# Patient Record
Sex: Female | Born: 1945 | Race: White | Hispanic: No | Marital: Married | Smoking: Former smoker
Health system: Southern US, Community
[De-identification: ages and names within clinical notes are randomized; demographics above are authoritative.]

## PROBLEM LIST (undated history)

## (undated) DIAGNOSIS — F429 Obsessive-compulsive disorder, unspecified: Secondary | ICD-10-CM

## (undated) DIAGNOSIS — I10 Essential (primary) hypertension: Secondary | ICD-10-CM

## (undated) DIAGNOSIS — F419 Anxiety disorder, unspecified: Secondary | ICD-10-CM

## (undated) DIAGNOSIS — Z87442 Personal history of urinary calculi: Secondary | ICD-10-CM

## (undated) DIAGNOSIS — M1990 Unspecified osteoarthritis, unspecified site: Secondary | ICD-10-CM

## (undated) HISTORY — PX: TUBAL LIGATION: SHX77

---

## 2011-06-28 ENCOUNTER — Emergency Department (HOSPITAL_BASED_OUTPATIENT_CLINIC_OR_DEPARTMENT_OTHER)
Admission: EM | Admit: 2011-06-28 | Discharge: 2011-06-28 | Disposition: A | Discharge status: Home / Self Care | Payer: Medicare Other | Attending: Emergency Medicine | Admitting: Emergency Medicine

## 2011-06-28 ENCOUNTER — Encounter (HOSPITAL_BASED_OUTPATIENT_CLINIC_OR_DEPARTMENT_OTHER): Payer: Self-pay | Admitting: Emergency Medicine

## 2011-06-28 DIAGNOSIS — IMO0002 Reserved for concepts with insufficient information to code with codable children: Secondary | ICD-10-CM | POA: Insufficient documentation

## 2011-06-28 DIAGNOSIS — S01511A Laceration without foreign body of lip, initial encounter: Secondary | ICD-10-CM

## 2011-06-28 DIAGNOSIS — S01501A Unspecified open wound of lip, initial encounter: Secondary | ICD-10-CM | POA: Insufficient documentation

## 2011-06-28 DIAGNOSIS — W19XXXA Unspecified fall, initial encounter: Secondary | ICD-10-CM

## 2011-06-28 DIAGNOSIS — W108XXA Fall (on) (from) other stairs and steps, initial encounter: Secondary | ICD-10-CM | POA: Insufficient documentation

## 2011-06-28 DIAGNOSIS — T148XXA Other injury of unspecified body region, initial encounter: Secondary | ICD-10-CM

## 2011-06-28 HISTORY — DX: Anxiety disorder, unspecified: F41.9

## 2011-06-28 HISTORY — DX: Obsessive-compulsive disorder, unspecified: F42.9

## 2011-06-28 HISTORY — DX: Essential (primary) hypertension: I10

## 2011-06-28 MED ORDER — TETANUS-DIPHTH-ACELL PERTUSSIS 5-2.5-18.5 LF-MCG/0.5 IM SUSP
0.5000 mL | Freq: Once | INTRAMUSCULAR | Status: AC
  Administered 2011-06-28: 0.5 mL via INTRAMUSCULAR
  Filled 2011-06-28: qty 0.5

## 2011-06-28 MED ORDER — LIDOCAINE HCL 2 % IJ SOLN
20.0000 mL | Freq: Once | INTRAMUSCULAR | Status: AC
  Administered 2011-06-28: 400 mg via INTRADERMAL
  Filled 2011-06-28: qty 1

## 2011-06-28 NOTE — ED Notes (Signed)
Pt tripped and fell while walking down steps.  Pt hit mouth, laceration to upper lip, bleeding controlled.  No LOC.  No vomiting.

## 2011-06-28 NOTE — Discharge Instructions (Signed)
Abrasions Abrasions are skin scrapes. Their treatment depends on how large and deep the abrasion is. Abrasions do not extend through all layers of the skin. A cut or lesion through all skin layers is called a laceration. HOME CARE INSTRUCTIONS   If you were given a dressing, change it at least once a day or as instructed by your caregiver. If the bandage sticks, soak it off with a solution of water or hydrogen peroxide.   Twice a day, wash the area with soap and water to remove all the cream/ointment. You may do this in a sink, under a tub faucet, or in a shower. Rinse off the soap and pat dry with a clean towel. Look for signs of infection (see below).   Reapply cream/ointment according to your caregiver's instruction. This will help prevent infection and keep the bandage from sticking. Telfa or gauze over the wound and under the dressing or wrap will also help keep the bandage from sticking.   If the bandage becomes wet, dirty, or develops a foul smell, change it as soon as possible.   Only take over-the-counter or prescription medicines for pain, discomfort, or fever as directed by your caregiver.  SEEK IMMEDIATE MEDICAL CARE IF:   Increasing pain in the wound.   Signs of infection develop: redness, swelling, surrounding area is tender to touch, or pus coming from the wound.   You have a fever.   Any foul smell coming from the wound or dressing.  Most skin wounds heal within ten days. Facial wounds heal faster. However, an infection may occur despite proper treatment. You should have the wound checked for signs of infection within 24 to 48 hours or sooner if problems arise. If you were not given a wound-check appointment, look closely at the wound yourself on the second day for early signs of infection listed above. MAKE SURE YOU:   Understand these instructions.   Will watch your condition.   Will get help right away if you are not doing well or get worse.  Document Released:  11/26/2004 Document Revised: 02/05/2011 Document Reviewed: 01/20/2011 [facility] Patient Information 2012 [place], [place].Laceration Care, Adult A laceration is a cut that goes through all layers of the skin. The cut goes into the tissue beneath the skin. HOME CARE For stitches (sutures) or staples:  Keep the cut clean and dry.   If you have a bandage (dressing), change it at least once a day. Change the bandage if it gets wet or dirty, or as told by your doctor.   Wash the cut with soap and water 2 times a day. Rinse the cut with water. Pat it dry with a clean towel.   Put a thin layer of medicated cream on the cut as told by your doctor.   You may shower after the first 24 hours. Do not soak the cut in water until the stitches are removed.   Only take medicines as told by your doctor.   Have your stitches or staples removed as told by your doctor.  For skin adhesive strips:  Keep the cut clean and dry.   Do not get the strips wet. You may take a bath, but be careful to keep the cut dry.   If the cut gets wet, pat it dry with a clean towel.   The strips will fall off on their own. Do not remove the strips that are still stuck to the cut.  For wound glue:  You may shower or take  baths. Do not soak or scrub the cut. Do not swim. Avoid heavy sweating until the glue falls off on its own. After a shower or bath, pat the cut dry with a clean towel.   Do not put medicine on your cut until the glue falls off.   If you have a bandage, do not put tape over the glue.   Avoid lots of sunlight or tanning lamps until the glue falls off. Put sunscreen on the cut for the first year to reduce your scar.   The glue will fall off on its own. Do not pick at the glue.  You may need a tetanus shot if:  You cannot remember when you had your last tetanus shot.   You have never had a tetanus shot.  If you need a tetanus shot and you choose not to have one, you may get tetanus. Sickness from tetanus  can be serious. GET HELP RIGHT AWAY IF:   Your pain does not get better with medicine.   Your arm, hand, leg, or foot loses feeling (numbness) or changes color.   Your cut is bleeding.   Your joint feels weak, or you cannot use your joint.   You have painful lumps on your body.   Your cut is red, puffy (swollen), or painful.   You have a red line on the skin near the cut.   You have yellowish-white fluid (pus) coming from the cut.   You have a fever.   You have a bad smell coming from the cut or bandage.   Your cut breaks open before or after stitches are removed.   You notice something coming out of the cut, such as wood or glass.   You cannot move a finger or toe.  MAKE SURE YOU:   Understand these instructions.   Will watch your condition.   Will get help right away if you are not doing well or get worse.  Document Released: 08/05/2007 Document Revised: 02/05/2011 Document Reviewed: 08/12/2010 [employer] [facility] Patient Information 2012 [place], [place].Stitches, Staples, or Skin Adhesive Strips  Stitches (sutures), staples, and skin adhesive strips hold the skin together as it heals. They will usually be in place for 7 days or less. HOME CARE  Wash your hands with soap and water before and after you touch your wound.   Only take medicine as told by your doctor.   Cover your wound only if your doctor told you to. Otherwise, leave it open to air.   Do not get your stitches wet or dirty. If they get dirty, dab them gently with a clean washcloth. Wet the washcloth with soapy water. Do not rub. Pat them dry gently.   Do not put medicine or medicated cream on your stitches unless your doctor told you to.   Do not take out your own stitches or staples. Skin adhesive strips will fall off by themselves.   Do not pick at the wound. Picking can cause an infection.   Do not miss your follow-up appointment.   If you have problems or questions, call your doctor.  GET HELP RIGHT  AWAY IF:   You have a temperature by mouth above 102 F (38.9 C), not controlled by medicine.   You have chills.   You have redness or pain around your stitches.   There is puffiness (swelling) around your stitches.   You notice fluid (drainage) from your stitches.   There is a bad smell coming from your wound.  MAKE  SURE YOU:  Understand these instructions.   Will watch your condition.   Will get help if you are not doing well or get worse.  Document Released: 12/14/2008 Document Revised: 02/05/2011 Document Reviewed: 12/14/2008 [facility] Patient Information 2012 [place], [place].

## 2011-06-28 NOTE — ED Provider Notes (Signed)
Medical screening examination/treatment/procedure(s) were performed by non-physician practitioner and as supervising physician I was immediately available for consultation/collaboration.  Gerhard Munch, MD 06/28/11 2157

## 2011-06-28 NOTE — ED Provider Notes (Signed)
History     CSN: [id number]  Arrival date & time 06/28/11  1827   First MD Initiated Contact with Patient 06/28/11 1847      Chief Complaint  Patient presents with  . Fall  . Lip Laceration    (Consider location/radiation/quality/duration/timing/severity/associated sxs/prior treatment) HPI Comments: Pt states that she tripped going up the stair and her lip his another step:pts states that she didn't have an loc:pt denies any problems opening and closing mouth  Patient is a 66 y.o. female presenting with fall. The history is provided by the patient. No language interpreter was used.  Fall The accident occurred less than 1 hour ago. She landed on concrete. Point of impact: upper lip. Pain location: upper lip. The pain is mild. She was ambulatory at the scene. There was no entrapment after the fall. There was no drug use involved in the accident. There was no alcohol use involved in the accident.    Past Medical History  Diagnosis Date  . Hypertension   . Vitamin d deficiency   . Anxiety   . OCD (obsessive compulsive disorder)     Past Surgical History  Procedure Date  . Tubal ligation     No family history on file.  History  Substance Use Topics  . Smoking status: Not on file  . Smokeless tobacco: Not on file  . Alcohol Use:     OB History    Grav Para Term Preterm Abortions TAB SAB Ect Mult Living                  Review of Systems  Constitutional: Negative.   HENT: Negative.   Respiratory: Negative.   Cardiovascular: Negative.   Musculoskeletal: Negative.   Skin: Positive for wound.    Allergies  Penicillins  Home Medications  No current outpatient prescriptions on file.  BP 129/81  Pulse 81  Temp(Src) 97.5 F (36.4 C) (Oral)  Resp 16  SpO2 98%  Physical Exam  Nursing note and vitals reviewed. Constitutional: She is oriented to person, place, and time. She appears well-developed and well-nourished.  HENT:       Pt has a laceration to the  right upper lip:pt has no dental injury noted:pt able to open and close without any problem  Eyes: Conjunctivae and EOM are normal. Pupils are equal, round, and reactive to light.  Neck: Neck supple.  Cardiovascular: Normal rate and regular rhythm.   Pulmonary/Chest: Effort normal and breath sounds normal.  Musculoskeletal: Normal range of motion.       Cervical back: Normal.       Thoracic back: Normal.       Lumbar back: Normal.       Abrasion to the right knee  Neurological: She is alert and oriented to person, place, and time.  Psychiatric: She has a normal mood and affect.    ED Course  LACERATION REPAIR Performed by: Teressa Lower Authorized by: Teressa Lower Consent: Verbal consent obtained. Written consent not obtained. Risks and benefits: risks, benefits and alternatives were discussed Consent given by: patient Patient understanding: patient states understanding of the procedure being performed Patient identity confirmed: verbally with patient Time out: Immediately prior to procedure a "time out" was called to verify the correct patient, procedure, equipment, support staff and site/side marked as required. Body area: head/neck Location details: upper lip Full thickness lip laceration: no Vermillion border involved: yes Lip laceration height: vermillion only Laceration length: 2 cm Foreign bodies: no foreign bodies Anesthesia: local  infiltration Local anesthetic: lidocaine 2% without epinephrine Irrigation solution: saline Irrigation method: syringe Amount of cleaning: standard Skin closure: 6-0 Prolene Number of sutures: 4 Technique: simple Approximation: close Approximation difficulty: simple Lip approximation: vermillion border well aligned Patient tolerance: Patient tolerated the procedure well with no immediate complications.   (including critical care time)  Labs Reviewed - No data to display No results found.   1. Lip laceration   2. Fall     3. Abrasion       MDM  Wound closed without any problem:don't think any imaging is needed at this time        Teressa Lower, NP 06/28/11 1930

## 2012-08-23 ENCOUNTER — Encounter (HOSPITAL_BASED_OUTPATIENT_CLINIC_OR_DEPARTMENT_OTHER): Payer: Self-pay | Admitting: Student

## 2012-08-23 ENCOUNTER — Emergency Department (HOSPITAL_BASED_OUTPATIENT_CLINIC_OR_DEPARTMENT_OTHER)
Admission: EM | Admit: 2012-08-23 | Discharge: 2012-08-23 | Disposition: A | Discharge status: Home / Self Care | Payer: Medicare Other | Attending: Emergency Medicine | Admitting: Emergency Medicine

## 2012-08-23 ENCOUNTER — Emergency Department (HOSPITAL_BASED_OUTPATIENT_CLINIC_OR_DEPARTMENT_OTHER): Payer: Medicare Other

## 2012-08-23 DIAGNOSIS — F429 Obsessive-compulsive disorder, unspecified: Secondary | ICD-10-CM | POA: Insufficient documentation

## 2012-08-23 DIAGNOSIS — F29 Unspecified psychosis not due to a substance or known physiological condition: Secondary | ICD-10-CM | POA: Insufficient documentation

## 2012-08-23 DIAGNOSIS — Z880 Allergy status to penicillin: Secondary | ICD-10-CM | POA: Insufficient documentation

## 2012-08-23 DIAGNOSIS — N390 Urinary tract infection, site not specified: Secondary | ICD-10-CM | POA: Insufficient documentation

## 2012-08-23 DIAGNOSIS — F411 Generalized anxiety disorder: Secondary | ICD-10-CM | POA: Insufficient documentation

## 2012-08-23 DIAGNOSIS — E559 Vitamin D deficiency, unspecified: Secondary | ICD-10-CM | POA: Insufficient documentation

## 2012-08-23 DIAGNOSIS — Z79899 Other long term (current) drug therapy: Secondary | ICD-10-CM | POA: Insufficient documentation

## 2012-08-23 DIAGNOSIS — I10 Essential (primary) hypertension: Secondary | ICD-10-CM | POA: Insufficient documentation

## 2012-08-23 LAB — COMPREHENSIVE METABOLIC PANEL
ALT: 27 U/L (ref 0–35)
BUN: 15 mg/dL (ref 6–23)
CO2: 30 mEq/L (ref 19–32)
Calcium: 9.9 mg/dL (ref 8.4–10.5)
GFR calc Af Amer: 58 mL/min — ABNORMAL LOW (ref >90)
GFR calc non Af Amer: 50 mL/min — ABNORMAL LOW (ref >90)
Glucose, Bld: 129 mg/dL — ABNORMAL HIGH (ref 70–99)
Sodium: 141 mEq/L (ref 135–145)
Total Protein: 7.1 g/dL (ref 6.0–8.3)

## 2012-08-23 LAB — ETHANOL: Alcohol, Ethyl (B): <11 mg/dL (ref 0–11)

## 2012-08-23 LAB — URINE MICROSCOPIC-ADD ON

## 2012-08-23 LAB — URINALYSIS, ROUTINE W REFLEX MICROSCOPIC
Bilirubin Urine: NEGATIVE
Glucose, UA: NEGATIVE mg/dL
Hgb urine dipstick: NEGATIVE
Ketones, ur: NEGATIVE mg/dL
Protein, ur: NEGATIVE mg/dL
Urobilinogen, UA: 0.2 mg/dL (ref 0.0–1.0)

## 2012-08-23 LAB — CBC WITH DIFFERENTIAL/PLATELET
Basophils Relative: 0 % (ref 0–1)
Eosinophils Absolute: 0 10*3/uL (ref 0.0–0.7)
Eosinophils Relative: 0 % (ref 0–5)
Hemoglobin: 15.2 g/dL — ABNORMAL HIGH (ref 12.0–15.0)
Lymphs Abs: 1 10*3/uL (ref 0.7–4.0)
MCH: 30.3 pg (ref 26.0–34.0)
MCHC: 33.8 g/dL (ref 30.0–36.0)
MCV: 89.6 fL (ref 78.0–100.0)
Monocytes Absolute: 0.5 10*3/uL (ref 0.1–1.0)
Monocytes Relative: 7 % (ref 3–12)
Neutrophils Relative %: 79 % — ABNORMAL HIGH (ref 43–77)
RBC: 5.02 MIL/uL (ref 3.87–5.11)

## 2012-08-23 LAB — RAPID URINE DRUG SCREEN, HOSP PERFORMED
Barbiturates: NOT DETECTED
Cocaine: NOT DETECTED

## 2012-08-23 MED ORDER — SULFAMETHOXAZOLE-TRIMETHOPRIM 800-160 MG PO TABS: 1.0000 | ORAL_TABLET | Freq: Two times a day (BID) | ORAL | Status: DC

## 2012-08-23 MED ORDER — SODIUM CHLORIDE 0.9 % IV BOLUS (SEPSIS)
1000.0000 mL | Freq: Once | INTRAVENOUS | Status: AC
  Administered 2012-08-23: 1000 mL via INTRAVENOUS

## 2012-08-23 NOTE — ED Notes (Signed)
Patient unable to void at this time

## 2012-08-23 NOTE — ED Notes (Signed)
Pt in with c/o feeling "off" or "unbalance" and reports starting Xanax 2 weeks ago. Reports onset yesterday morning. Gait steady, ambulates well. Speech is clear but pt does have occasional hesitation with getting words out. Grips= and strong. Denies N V D CP LOC SOB dizziness headaches or visual disturbances

## 2012-08-23 NOTE — ED Provider Notes (Signed)
History    CSN: [id number] Arrival date & time 08/23/12  4098  First MD Initiated Contact with Patient 08/23/12 [phone]     Chief Complaint  Patient presents with  . Altered Mental Status   (Consider location/radiation/quality/duration/timing/severity/associated sxs/prior Treatment) Patient is a 67 y.o. female presenting with altered mental status. The history is provided by the spouse.  Altered Mental Status Presenting symptoms: confusion   Severity:  Moderate Most recent episode:  Yesterday Episode history:  Continuous Duration:  1 day Timing:  Constant Progression:  Worsening Chronicity:  New Context: recent change in medication   Context: not alcohol use, not head injury, not a recent illness and not a recent infection   Context comment:  States that she could not figure out how to put on her underwear today, yesterday did not know how to put a cap on her bottle and several otehr things which is very unusual for her Associated symptoms: no abdominal pain, no bladder incontinence, no decreased appetite, no fever, no nausea, no seizures, no slurred speech, no vomiting and no weakness   Associated symptoms comment:  No slurred speech but does hesitate occasionally and also sometimes says things that don't make sense.  Also shuffling unsteady gait.  Past Medical History  Diagnosis Date  . Hypertension   . Vitamin D deficiency   . Anxiety   . OCD (obsessive compulsive disorder)    Past Surgical History  Procedure Laterality Date  . Tubal ligation     History reviewed. No pertinent family history. History  Substance Use Topics  . Smoking status: Not on file  . Smokeless tobacco: Not on file  . Alcohol Use: Yes   OB History   Grav Para Term Preterm Abortions TAB SAB Ect Mult Living                 Review of Systems  Constitutional: Negative for fever and decreased appetite.  Gastrointestinal: Negative for nausea, vomiting and abdominal pain.  Genitourinary: Negative  for bladder incontinence.  Neurological: Negative for seizures and weakness.  Psychiatric/Behavioral: Positive for confusion and altered mental status.  All other systems reviewed and are negative.    Allergies  Penicillins  Home Medications   Current Outpatient Rx  Name  Route  Sig  Dispense  Refill  . cholecalciferol (VITAMIN D) 1000 UNITS tablet   Oral   Take 1,000 Units by mouth daily.         . fluvoxaMINE (LUVOX) 100 MG tablet   Oral   Take 100 mg by mouth at bedtime.         . hydrochlorothiazide (HYDRODIURIL) 25 MG tablet   Oral   Take 25 mg by mouth daily.         . Multiple Vitamins-Minerals (CENTRUM SILVER) tablet   Oral   Take 1 tablet by mouth daily.         Marland Kitchen OLANZapine (ZYPREXA) 10 MG tablet   Oral   Take 10 mg by mouth at bedtime.          BP 169/88  Pulse 78  Temp(Src) 97.6 F (36.4 C) (Oral)  Resp 16  Ht 5\' 6"  (1.676 m)  Wt 165 lb (74.844 kg)  BMI 26.64 kg/m2  SpO2 97% Physical Exam  Nursing note and vitals reviewed. Constitutional: She is oriented to person, place, and time. She appears well-developed and well-nourished. No distress.  HENT:  Head: Normocephalic and atraumatic.  Mouth/Throat: Oropharynx is clear and moist.  Eyes: Conjunctivae and  EOM are normal. Pupils are equal, round, and reactive to light.  Neck: Normal range of motion. Neck supple. Carotid bruit is not present.  Cardiovascular: Normal rate, regular rhythm and intact distal pulses.   No murmur heard. Pulmonary/Chest: Effort normal and breath sounds normal. No respiratory distress. She has no wheezes. She has no rales.  Abdominal: Soft. She exhibits no distension. There is no tenderness. There is no rebound and no guarding.  Musculoskeletal: Normal range of motion. She exhibits no edema and no tenderness.  Neurological: She is alert and oriented to person, place, and time. No cranial nerve deficit.  Mild intention tremor in all ext.  Normal strenght and  sensation.  Speech is hesitant and sometimes makes statements that don't make sense  Skin: Skin is warm and dry. No rash noted. No erythema.  Psychiatric: She has a normal mood and affect. Her behavior is normal.    ED Course  Procedures (including critical care time) Labs Reviewed  CBC WITH DIFFERENTIAL - Abnormal; Notable for the following:    Hemoglobin 15.2 (*)    Neutrophils Relative % 79 (*)    All other components within normal limits  URINALYSIS, ROUTINE W REFLEX MICROSCOPIC - Abnormal; Notable for the following:    Nitrite POSITIVE (*)    Leukocytes, UA MODERATE (*)    All other components within normal limits  COMPREHENSIVE METABOLIC PANEL - Abnormal; Notable for the following:    Potassium 3.0 (*)    Glucose, Bld 129 (*)    GFR calc non Af Amer 50 (*)    GFR calc Af Amer 58 (*)    All other components within normal limits  URINE MICROSCOPIC-ADD ON - Abnormal; Notable for the following:    Bacteria, UA MANY (*)    All other components within normal limits  URINE CULTURE  ETHANOL  URINE RAPID DRUG SCREEN (HOSP PERFORMED)   Ct Head Wo Contrast  08/23/2012   *RADIOLOGY REPORT*  Clinical Data: Confusion.  Altered mental status.  CT HEAD WITHOUT CONTRAST  Technique:  Contiguous axial images were obtained from the base of the skull through the vertex without contrast.  Comparison: None.  Findings: No mass lesion, mass effect, midline shift, hydrocephalus, hemorrhage.  No territorial ischemia or acute infarction.  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  Mastoid air cells are clear.  Paranasal sinuses appear within normal limits.  IMPRESSION: No acute intracranial abnormality.  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   Original Report Authenticated By: Andreas Newport, M.D.   1. UTI (lower urinary tract infection)     MDM   Patient is here do to worsening confusion over the last 24 hours. Husband states that she had a heart  time putting on her underwear this morning, she has been unsteady on her feet and also having difficulty getting words out. Facial droop or focal weakness. Patient has had a normal appetite without fever or infectious symptoms. Husband states she did start Xanax 2 weeks ago and takes 0.5 mg one time a day. He states her symptoms do not seem to be worse or better in the morning or night. He does state she's had something like this in the past but not this bad and was related to medications. Other than the Xanax the Luvox is the only other more recent medication but she's been on it for 2 months.  Patient has no focal neurologic finding but does have slow hesitant speech and occasionally will say things that  don't make sense. Mild tremor in bilateral upper extremity and shuffling unsteady gait. She was awake and alert and oriented x3. She denies any drug or alcohol use.  Consent the patient's symptoms are related to medication versus infection versus electrolyte abnormality versus brain lesion. Low suspicion for prescription at this time  CBC, CMP, UA, UDS, EtOH, head CT pending.  1:22 PM Labs consistent with UTI which would explain her sx.  No acute abnormalities on head CT   Gwyneth Sprout, MD 08/23/12 1322

## 2012-08-25 LAB — URINE CULTURE

## 2012-08-26 NOTE — ED Notes (Signed)
Post ED Visit - Positive Culture Follow-up  Culture report reviewed by antimicrobial stewardship pharmacist: []  Wes Dulaney, Pharm.D., BCPS [x]  Celedonio Miyamoto, Pharm.D., BCPS []  Georgina Pillion, Pharm.D., BCPS []  [place], [address].D., BCPS, AAHIVP []  Estella Husk, Pharm.D., BCPS, AAHIVP  Positive Urine culture Treated with Sulfa-Trim, organism sensitive to the same and no further patient follow-up is required at this time.  Larena Sox 08/26/2012, 2:10 PM

## 2013-01-05 ENCOUNTER — Other Ambulatory Visit: Payer: Self-pay | Admitting: Family Medicine

## 2013-01-05 DIAGNOSIS — Z1231 Encounter for screening mammogram for malignant neoplasm of breast: Secondary | ICD-10-CM

## 2013-03-10 ENCOUNTER — Ambulatory Visit
Admission: RE | Admit: 2013-03-10 | Discharge: 2013-03-10 | Disposition: A | Discharge status: Home / Self Care | Payer: Medicare Other | Source: Ambulatory Visit | Attending: Family Medicine | Admitting: Family Medicine

## 2013-03-10 DIAGNOSIS — Z1231 Encounter for screening mammogram for malignant neoplasm of breast: Secondary | ICD-10-CM

## 2014-01-08 ENCOUNTER — Other Ambulatory Visit (HOSPITAL_COMMUNITY)
Admission: RE | Admit: 2014-01-08 | Discharge: 2014-01-08 | Disposition: A | Discharge status: Home / Self Care | Payer: Medicare Other | Source: Ambulatory Visit | Attending: Family Medicine | Admitting: Family Medicine

## 2014-01-08 ENCOUNTER — Other Ambulatory Visit: Payer: Self-pay | Admitting: Family Medicine

## 2014-01-08 DIAGNOSIS — Z124 Encounter for screening for malignant neoplasm of cervix: Secondary | ICD-10-CM | POA: Insufficient documentation

## 2014-01-11 LAB — CYTOLOGY - PAP

## 2014-03-21 DIAGNOSIS — R4189 Other symptoms and signs involving cognitive functions and awareness: Secondary | ICD-10-CM | POA: Insufficient documentation

## 2014-03-21 DIAGNOSIS — G4733 Obstructive sleep apnea (adult) (pediatric): Secondary | ICD-10-CM | POA: Diagnosis present

## 2014-03-21 DIAGNOSIS — R269 Unspecified abnormalities of gait and mobility: Secondary | ICD-10-CM

## 2014-03-21 DIAGNOSIS — G629 Polyneuropathy, unspecified: Secondary | ICD-10-CM | POA: Insufficient documentation

## 2014-05-30 DIAGNOSIS — G4734 Idiopathic sleep related nonobstructive alveolar hypoventilation: Secondary | ICD-10-CM | POA: Insufficient documentation

## 2015-01-21 ENCOUNTER — Other Ambulatory Visit: Payer: Self-pay

## 2015-01-21 ENCOUNTER — Other Ambulatory Visit: Payer: Self-pay | Admitting: Family Medicine

## 2015-01-21 DIAGNOSIS — Z1231 Encounter for screening mammogram for malignant neoplasm of breast: Secondary | ICD-10-CM

## 2015-01-21 DIAGNOSIS — R911 Solitary pulmonary nodule: Secondary | ICD-10-CM

## 2015-01-28 ENCOUNTER — Ambulatory Visit
Admission: RE | Admit: 2015-01-28 | Discharge: 2015-01-28 | Disposition: A | Discharge status: Home / Self Care | Payer: Medicare Other | Source: Ambulatory Visit | Attending: Family Medicine | Admitting: Family Medicine

## 2015-01-28 DIAGNOSIS — R911 Solitary pulmonary nodule: Secondary | ICD-10-CM

## 2015-02-13 ENCOUNTER — Ambulatory Visit
Admission: RE | Admit: 2015-02-13 | Discharge: 2015-02-13 | Disposition: A | Discharge status: Home / Self Care | Payer: Medicare Other | Source: Ambulatory Visit

## 2015-02-13 DIAGNOSIS — Z1231 Encounter for screening mammogram for malignant neoplasm of breast: Secondary | ICD-10-CM

## 2015-09-11 DIAGNOSIS — Z9181 History of falling: Secondary | ICD-10-CM

## 2016-01-20 ENCOUNTER — Other Ambulatory Visit: Payer: Self-pay | Admitting: Family Medicine

## 2016-01-20 DIAGNOSIS — Z1231 Encounter for screening mammogram for malignant neoplasm of breast: Secondary | ICD-10-CM

## 2016-02-28 ENCOUNTER — Ambulatory Visit
Admission: RE | Admit: 2016-02-28 | Discharge: 2016-02-28 | Disposition: A | Discharge status: Home / Self Care | Payer: Medicare Other | Source: Ambulatory Visit | Attending: Family Medicine | Admitting: Family Medicine

## 2016-02-28 DIAGNOSIS — Z1231 Encounter for screening mammogram for malignant neoplasm of breast: Secondary | ICD-10-CM

## 2017-03-19 ENCOUNTER — Other Ambulatory Visit: Payer: Self-pay

## 2017-03-19 ENCOUNTER — Inpatient Hospital Stay (HOSPITAL_BASED_OUTPATIENT_CLINIC_OR_DEPARTMENT_OTHER)
Admission: EM | Admit: 2017-03-19 | Discharge: 2017-03-29 | DRG: 405 | Disposition: A | Discharge status: Home Health | Payer: Medicare Other | Attending: Surgery | Admitting: Surgery

## 2017-03-19 ENCOUNTER — Encounter (HOSPITAL_BASED_OUTPATIENT_CLINIC_OR_DEPARTMENT_OTHER): Payer: Self-pay | Admitting: *Deleted

## 2017-03-19 DIAGNOSIS — Z880 Allergy status to penicillin: Secondary | ICD-10-CM

## 2017-03-19 DIAGNOSIS — F429 Obsessive-compulsive disorder, unspecified: Secondary | ICD-10-CM | POA: Diagnosis present

## 2017-03-19 DIAGNOSIS — E876 Hypokalemia: Secondary | ICD-10-CM

## 2017-03-19 DIAGNOSIS — K822 Perforation of gallbladder: Secondary | ICD-10-CM

## 2017-03-19 DIAGNOSIS — I10 Essential (primary) hypertension: Secondary | ICD-10-CM | POA: Diagnosis present

## 2017-03-19 DIAGNOSIS — T8143XA Infection following a procedure, organ and space surgical site, initial encounter: Secondary | ICD-10-CM

## 2017-03-19 DIAGNOSIS — K810 Acute cholecystitis: Principal | ICD-10-CM | POA: Diagnosis present

## 2017-03-19 DIAGNOSIS — K653 Choleperitonitis: Secondary | ICD-10-CM

## 2017-03-19 DIAGNOSIS — F0390 Unspecified dementia without behavioral disturbance: Secondary | ICD-10-CM | POA: Diagnosis present

## 2017-03-19 DIAGNOSIS — K812 Acute cholecystitis with chronic cholecystitis: Secondary | ICD-10-CM

## 2017-03-19 DIAGNOSIS — K819 Cholecystitis, unspecified: Secondary | ICD-10-CM

## 2017-03-19 DIAGNOSIS — E860 Dehydration: Secondary | ICD-10-CM | POA: Diagnosis present

## 2017-03-19 DIAGNOSIS — Z9049 Acquired absence of other specified parts of digestive tract: Secondary | ICD-10-CM

## 2017-03-19 DIAGNOSIS — R269 Unspecified abnormalities of gait and mobility: Secondary | ICD-10-CM

## 2017-03-19 DIAGNOSIS — D72829 Elevated white blood cell count, unspecified: Secondary | ICD-10-CM

## 2017-03-19 DIAGNOSIS — K82A2 Perforation of gallbladder in cholecystitis: Secondary | ICD-10-CM | POA: Diagnosis present

## 2017-03-19 DIAGNOSIS — R1310 Dysphagia, unspecified: Secondary | ICD-10-CM | POA: Diagnosis present

## 2017-03-19 DIAGNOSIS — Z9181 History of falling: Secondary | ICD-10-CM

## 2017-03-19 DIAGNOSIS — K660 Peritoneal adhesions (postprocedural) (postinfection): Secondary | ICD-10-CM | POA: Diagnosis present

## 2017-03-19 DIAGNOSIS — Z79899 Other long term (current) drug therapy: Secondary | ICD-10-CM

## 2017-03-19 DIAGNOSIS — T8149XA Infection following a procedure, other surgical site, initial encounter: Secondary | ICD-10-CM

## 2017-03-19 DIAGNOSIS — Z87891 Personal history of nicotine dependence: Secondary | ICD-10-CM

## 2017-03-19 DIAGNOSIS — F419 Anxiety disorder, unspecified: Secondary | ICD-10-CM

## 2017-03-19 DIAGNOSIS — G4733 Obstructive sleep apnea (adult) (pediatric): Secondary | ICD-10-CM | POA: Diagnosis present

## 2017-03-19 LAB — COMPREHENSIVE METABOLIC PANEL
ALT: 40 U/L (ref 14–54)
AST: 46 U/L — ABNORMAL HIGH (ref 15–41)
Albumin: 3.7 g/dL (ref 3.5–5.0)
Alkaline Phosphatase: 109 U/L (ref 38–126)
Anion gap: 11 (ref 5–15)
BUN: 17 mg/dL (ref 6–20)
CHLORIDE: 99 mmol/L — AB (ref 101–111)
CO2: 25 mmol/L (ref 22–32)
CREATININE: 0.82 mg/dL (ref 0.44–1.00)
Calcium: 8.6 mg/dL — ABNORMAL LOW (ref 8.9–10.3)
Glucose, Bld: 169 mg/dL — ABNORMAL HIGH (ref 65–99)
POTASSIUM: 3.2 mmol/L — AB (ref 3.5–5.1)
Sodium: 135 mmol/L (ref 135–145)
Total Bilirubin: 0.8 mg/dL (ref 0.3–1.2)
Total Protein: 7.1 g/dL (ref 6.5–8.1)

## 2017-03-19 LAB — URINALYSIS, ROUTINE W REFLEX MICROSCOPIC
BILIRUBIN URINE: NEGATIVE
Glucose, UA: NEGATIVE mg/dL
Ketones, ur: NEGATIVE mg/dL
Nitrite: POSITIVE — AB
PROTEIN: NEGATIVE mg/dL
Specific Gravity, Urine: 1.01 (ref 1.005–1.030)
pH: 6 (ref 5.0–8.0)

## 2017-03-19 LAB — CBC WITH DIFFERENTIAL/PLATELET
BASOS ABS: 0 10*3/uL (ref 0.0–0.1)
BASOS PCT: 0 %
EOS ABS: 0 10*3/uL (ref 0.0–0.7)
EOS PCT: 0 %
HCT: 37.8 % (ref 36.0–46.0)
Hemoglobin: 11.9 g/dL — ABNORMAL LOW (ref 12.0–15.0)
Lymphocytes Relative: 8 %
Lymphs Abs: 1.2 10*3/uL (ref 0.7–4.0)
MCH: 27 pg (ref 26.0–34.0)
MCHC: 31.5 g/dL (ref 30.0–36.0)
MCV: 85.9 fL (ref 78.0–100.0)
Monocytes Absolute: 0.8 10*3/uL (ref 0.1–1.0)
Monocytes Relative: 5 %
Neutro Abs: 12.5 10*3/uL — ABNORMAL HIGH (ref 1.7–7.7)
Neutrophils Relative %: 87 %
PLATELETS: 198 10*3/uL (ref 150–400)
RBC: 4.4 MIL/uL (ref 3.87–5.11)
RDW: 15.8 % — ABNORMAL HIGH (ref 11.5–15.5)
WBC: 14.5 10*3/uL — AB (ref 4.0–10.5)

## 2017-03-19 LAB — URINALYSIS, MICROSCOPIC (REFLEX)

## 2017-03-19 LAB — LIPASE, BLOOD: LIPASE: 47 U/L (ref 11–51)

## 2017-03-19 MED ORDER — FENTANYL CITRATE (PF) 100 MCG/2ML IJ SOLN
50.0000 ug | Freq: Once | INTRAMUSCULAR | Status: AC
  Administered 2017-03-19: 50 ug via INTRAVENOUS
  Filled 2017-03-19: qty 2

## 2017-03-19 MED ORDER — ONDANSETRON HCL 4 MG/2ML IJ SOLN
4.0000 mg | Freq: Once | INTRAMUSCULAR | Status: AC
  Administered 2017-03-19: 4 mg via INTRAVENOUS
  Filled 2017-03-19: qty 2

## 2017-03-19 NOTE — ED Notes (Signed)
EDP into room 

## 2017-03-19 NOTE — ED Triage Notes (Signed)
Pt c/o right lower abd pain  x 15 hrs

## 2017-03-19 NOTE — ED Provider Notes (Signed)
TIME SEEN: 11:45 PM  CHIEF COMPLAINT: Right upper abdominal pain  HPI: Patient is a [age] year old female with history of anxiety, hypertension who presents emergency department with right upper quadrant abdominal pain that started this morning.  Describes as sharp, severe without radiation.  Did have diarrhea yesterday.  No nausea, vomiting, fever, chills, bloody stools, melena, dysuria, hematuria, vaginal bleeding or discharge.  She has had a previous BTL.  Has not had a bowel movement today.  Has never had similar symptoms.  No known aggravating or factors.  No chest pain or shortness of breath.  PCP - Corliss BlackerMcNeill with [facility]  ROS: See HPI Constitutional: no fever  Eyes: no drainage  ENT: no runny nose   Cardiovascular:  no chest pain  Resp: no SOB  GI: no vomiting GU: no dysuria Integumentary: no rash  Allergy: no hives  Musculoskeletal: no leg swelling  Neurological: no slurred speech ROS otherwise negative  PAST MEDICAL HISTORY/PAST SURGICAL HISTORY:  Past Medical History:  Diagnosis Date  . Anxiety   . Hypertension   . OCD (obsessive compulsive disorder)   . Vitamin D deficiency     MEDICATIONS:  Prior to Admission medications   Medication Sig Start Date End Date Taking? Authorizing Provider  cholecalciferol (VITAMIN D) 1000 UNITS tablet Take 1,000 Units by mouth daily.    [provider]  fluvoxaMINE (LUVOX) 100 MG tablet Take 100 mg by mouth at bedtime.    [provider]  hydrochlorothiazide (HYDRODIURIL) 25 MG tablet Take 25 mg by mouth daily.    [provider]  Multiple Vitamins-Minerals (CENTRUM SILVER) tablet Take 1 tablet by mouth daily.    [provider]  OLANZapine (ZYPREXA) 10 MG tablet Take 10 mg by mouth at bedtime.    [provider]    ALLERGIES:  Allergies  Allergen Reactions  . Penicillins Hives    SOCIAL HISTORY:  Social History   Tobacco Use  . Smoking status: Former Games developermoker  . Smokeless tobacco: Never  Used  Substance Use Topics  . Alcohol use: Yes    FAMILY HISTORY: History reviewed. No pertinent family history.  EXAM: BP 108/73 (BP Location: Left Arm)   Pulse (!) 101   Temp 98 F (36.7 C) (Oral)   Resp (!) 26   Ht 5\' 4"  (1.626 m)   Wt 77.1 kg (170 lb)   SpO2 96%   BMI 29.18 kg/m  CONSTITUTIONAL: Alert and oriented and responds appropriately to questions.  Elderly, appears uncomfortable HEAD: Normocephalic EYES: Conjunctivae clear, pupils appear equal, EOMI ENT: normal nose; moist mucous membranes NECK: Supple, no meningismus, no nuchal rigidity, no LAD  CARD: RRR; S1 and S2 appreciated; no murmurs, no clicks, no rubs, no gallops RESP: Normal chest excursion without splinting or tachypnea; breath sounds clear and equal bilaterally; no wheezes, no rhonchi, no rales, no hypoxia or respiratory distress, speaking full sentences ABD/GI: Normal bowel sounds; non-distended; soft, mildly tender throughout the abdomen but especially in the right upper quadrant with a positive Murphy sign, no specific tenderness at McBurney's point, no rebound, no guarding, no peritoneal signs, no hepatosplenomegaly BACK:  The back appears normal and is non-tender to palpation, there is no CVA tenderness EXT: Normal ROM in all joints; non-tender to palpation; no edema; normal capillary refill; no cyanosis, no calf tenderness or swelling    SKIN: Normal color for age and race; warm; no rash NEURO: Moves all extremities equally PSYCH: The patient's mood and manner are appropriate. Grooming and personal hygiene are  appropriate.  MEDICAL DECISION MAKING: Patient here with right upper quadrant pain with positive Murphy sign.  Concern for cholecystitis.  Differential also includes pancreatitis, gastritis, bowel obstruction, colitis.  Less likely appendicitis, pyelonephritis, UTI.  No urinary symptoms.  Unfortunately at this time we are unable to obtain a ultrasound at [facility].  Will obtain a CT scan  for further evaluation.  Will give IV fluids, pain and nausea medicine.  Labs and urine pending.  ED PROGRESS: Labs show leukocytosis of 14,000 with left shift.  Mildly elevated AST but otherwise normal LFTs and lipase.  Urine does show nitrites and many bacteria with trace leukocytes.  We will add on urine culture but she is not currently having urinary symptoms.  CT scan pending.   1:20 AM  CT scan shows inflammatory changes of the gallbladder consistent with acute cholecystitis.  No calcified stones visualized.  She also has gallbladder edema noted.  There is mild dilation of the intrahepatic biliary tree and common bile duct.  She has a moderate hiatal hernia.  No other acute abnormality.  Appendix not visualized but no inflammatory changes within the right lower quadrant.  Patient will be given ceftriaxone and Flagyl.  Rocephin would also cover for possible UTI given urine results.  D/w Dr. Michaell Cowing with general surgery.  He would like patient transferred to the [facility] emergency department where he will evaluate her in the ED.  Patient and husband have been updated with this plan.  They are comfortable with transport by [facility] to [facility].  She remains n.p.o.  She is receiving IV fluids and has standing pain and nausea medicine ordered.   1:30 AM  D/w Dr. Preston Fleeting with [facility] who agrees to accept patient in transfer.     2:20 AM  [facility] at bedside for transport.  Patient remains hemodynamically stable.  Pain controlled currently.   I reviewed all nursing notes, vitals, pertinent previous records, EKGs, lab and urine results, imaging (as available).     Ward, Layla Maw, DO 03/20/17 [phone]

## 2017-03-19 NOTE — ED Notes (Signed)
Alert, NAD, calm, interactive, resps e/u, speaking in clear complete sentences, no dyspnea noted, skin W&D, VSS, c/o RUQ pain, onset this morning, recent family GI illness last week, also admits to diarrhea yesterday (watery/green), (denies: CP, back pain, GU sx, NVD, fever, sob, syncope, bleeding, dizziness or visual changes). Last ate 1700 (toast), last BM yesterday. No relief with tylenol. [facility]

## 2017-03-20 ENCOUNTER — Encounter (HOSPITAL_BASED_OUTPATIENT_CLINIC_OR_DEPARTMENT_OTHER): Payer: Self-pay

## 2017-03-20 ENCOUNTER — Inpatient Hospital Stay (HOSPITAL_COMMUNITY): Payer: Medicare Other | Admitting: Anesthesiology

## 2017-03-20 ENCOUNTER — Inpatient Hospital Stay (HOSPITAL_COMMUNITY): Payer: Medicare Other

## 2017-03-20 ENCOUNTER — Encounter (HOSPITAL_COMMUNITY): Admission: EM | Disposition: A | Discharge status: Home Health | Payer: Self-pay | Source: Home / Self Care

## 2017-03-20 ENCOUNTER — Emergency Department (HOSPITAL_BASED_OUTPATIENT_CLINIC_OR_DEPARTMENT_OTHER): Payer: Medicare Other

## 2017-03-20 DIAGNOSIS — K653 Choleperitonitis: Secondary | ICD-10-CM | POA: Diagnosis present

## 2017-03-20 DIAGNOSIS — Z87891 Personal history of nicotine dependence: Secondary | ICD-10-CM | POA: Diagnosis not present

## 2017-03-20 DIAGNOSIS — F419 Anxiety disorder, unspecified: Secondary | ICD-10-CM

## 2017-03-20 DIAGNOSIS — R1310 Dysphagia, unspecified: Secondary | ICD-10-CM | POA: Diagnosis present

## 2017-03-20 DIAGNOSIS — E876 Hypokalemia: Secondary | ICD-10-CM | POA: Diagnosis present

## 2017-03-20 DIAGNOSIS — E860 Dehydration: Secondary | ICD-10-CM | POA: Diagnosis present

## 2017-03-20 DIAGNOSIS — G4733 Obstructive sleep apnea (adult) (pediatric): Secondary | ICD-10-CM | POA: Diagnosis present

## 2017-03-20 DIAGNOSIS — K822 Perforation of gallbladder: Secondary | ICD-10-CM

## 2017-03-20 DIAGNOSIS — Z9049 Acquired absence of other specified parts of digestive tract: Secondary | ICD-10-CM

## 2017-03-20 DIAGNOSIS — Z79899 Other long term (current) drug therapy: Secondary | ICD-10-CM | POA: Diagnosis not present

## 2017-03-20 DIAGNOSIS — K8000 Calculus of gallbladder with acute cholecystitis without obstruction: Secondary | ICD-10-CM | POA: Insufficient documentation

## 2017-03-20 DIAGNOSIS — K82A2 Perforation of gallbladder in cholecystitis: Secondary | ICD-10-CM | POA: Diagnosis present

## 2017-03-20 DIAGNOSIS — K660 Peritoneal adhesions (postprocedural) (postinfection): Secondary | ICD-10-CM | POA: Diagnosis present

## 2017-03-20 DIAGNOSIS — I10 Essential (primary) hypertension: Secondary | ICD-10-CM | POA: Diagnosis present

## 2017-03-20 DIAGNOSIS — F429 Obsessive-compulsive disorder, unspecified: Secondary | ICD-10-CM | POA: Diagnosis present

## 2017-03-20 DIAGNOSIS — Z9181 History of falling: Secondary | ICD-10-CM | POA: Diagnosis not present

## 2017-03-20 DIAGNOSIS — F0390 Unspecified dementia without behavioral disturbance: Secondary | ICD-10-CM | POA: Diagnosis present

## 2017-03-20 DIAGNOSIS — K810 Acute cholecystitis: Secondary | ICD-10-CM | POA: Diagnosis present

## 2017-03-20 DIAGNOSIS — K812 Acute cholecystitis with chronic cholecystitis: Secondary | ICD-10-CM | POA: Diagnosis present

## 2017-03-20 DIAGNOSIS — Z880 Allergy status to penicillin: Secondary | ICD-10-CM | POA: Diagnosis not present

## 2017-03-20 HISTORY — PX: LAPAROSCOPIC CHOLECYSTECTOMY SINGLE PORT: SHX5891

## 2017-03-20 LAB — GLUCOSE, CAPILLARY
GLUCOSE-CAPILLARY: 147 mg/dL — AB (ref 65–99)
Glucose-Capillary: 130 mg/dL — ABNORMAL HIGH (ref 65–99)
Glucose-Capillary: 151 mg/dL — ABNORMAL HIGH (ref 65–99)

## 2017-03-20 LAB — CBG MONITORING, ED: Glucose-Capillary: 127 mg/dL — ABNORMAL HIGH (ref 65–99)

## 2017-03-20 LAB — HEMOGLOBIN A1C
HEMOGLOBIN A1C: 5.1 % (ref 4.8–5.6)
MEAN PLASMA GLUCOSE: 99.67 mg/dL

## 2017-03-20 LAB — MAGNESIUM: Magnesium: 1.6 mg/dL — ABNORMAL LOW (ref 1.7–2.4)

## 2017-03-20 SURGERY — LAPAROSCOPIC CHOLECYSTECTOMY SINGLE SITE
Anesthesia: General | Site: Abdomen

## 2017-03-20 MED ORDER — ONDANSETRON 4 MG PO TBDP
4.0000 mg | ORAL_TABLET | Freq: Four times a day (QID) | ORAL | Status: DC | PRN
  Administered 2017-03-24: 4 mg via ORAL
  Filled 2017-03-20: qty 1

## 2017-03-20 MED ORDER — ONDANSETRON HCL 4 MG/2ML IJ SOLN
INTRAMUSCULAR | Status: AC
  Filled 2017-03-20: qty 2

## 2017-03-20 MED ORDER — SIMETHICONE 80 MG PO CHEW
40.0000 mg | CHEWABLE_TABLET | Freq: Four times a day (QID) | ORAL | Status: DC | PRN
  Filled 2017-03-20: qty 1

## 2017-03-20 MED ORDER — PHENOL 1.4 % MT LIQD
1.0000 | OROMUCOSAL | Status: DC | PRN
  Filled 2017-03-20: qty 177

## 2017-03-20 MED ORDER — IOPAMIDOL (ISOVUE-300) INJECTION 61%
INTRAVENOUS | Status: AC
  Filled 2017-03-20: qty 50

## 2017-03-20 MED ORDER — SUCCINYLCHOLINE CHLORIDE 200 MG/10ML IV SOSY
PREFILLED_SYRINGE | INTRAVENOUS | Status: AC
  Filled 2017-03-20: qty 20

## 2017-03-20 MED ORDER — LIDOCAINE 2% (20 MG/ML) 5 ML SYRINGE
INTRAMUSCULAR | Status: DC | PRN
  Administered 2017-03-20: 60 mg via INTRAVENOUS

## 2017-03-20 MED ORDER — ALUM & MAG HYDROXIDE-SIMETH 200-200-20 MG/5ML PO SUSP: 30.0000 mL | Freq: Four times a day (QID) | ORAL | Status: DC | PRN

## 2017-03-20 MED ORDER — LACTATED RINGERS IV SOLN
INTRAVENOUS | Status: DC | PRN
  Administered 2017-03-20 (×3): via INTRAVENOUS

## 2017-03-20 MED ORDER — MENTHOL 3 MG MT LOZG
1.0000 | LOZENGE | OROMUCOSAL | Status: DC | PRN
  Filled 2017-03-20: qty 9

## 2017-03-20 MED ORDER — PSYLLIUM 95 % PO PACK
1.0000 | PACK | Freq: Every day | ORAL | Status: DC
  Administered 2017-03-24 – 2017-03-29 (×5): 1 via ORAL
  Filled 2017-03-20 (×7): qty 1

## 2017-03-20 MED ORDER — LACTATED RINGERS IR SOLN
Status: DC | PRN
  Administered 2017-03-20: 3000 mL

## 2017-03-20 MED ORDER — CIPROFLOXACIN IN D5W 400 MG/200ML IV SOLN
400.0000 mg | Freq: Two times a day (BID) | INTRAVENOUS | Status: AC
  Administered 2017-03-20 – 2017-03-26 (×14): 400 mg via INTRAVENOUS
  Filled 2017-03-20 (×14): qty 200

## 2017-03-20 MED ORDER — CHLORHEXIDINE GLUCONATE CLOTH 2 % EX PADS: 6.0000 | MEDICATED_PAD | Freq: Once | CUTANEOUS | Status: DC

## 2017-03-20 MED ORDER — MIDAZOLAM HCL 2 MG/2ML IJ SOLN
INTRAMUSCULAR | Status: AC
  Filled 2017-03-20: qty 2

## 2017-03-20 MED ORDER — FOLIC ACID-VIT B6-VIT B12 2.5-25-1 MG PO TABS: 2.0000 | ORAL_TABLET | Freq: Every day | ORAL | Status: DC

## 2017-03-20 MED ORDER — DULOXETINE HCL 60 MG PO CPEP
60.0000 mg | ORAL_CAPSULE | Freq: Every day | ORAL | Status: DC
  Administered 2017-03-21 – 2017-03-29 (×9): 60 mg via ORAL
  Filled 2017-03-20: qty 1
  Filled 2017-03-20: qty 2
  Filled 2017-03-20 (×6): qty 1
  Filled 2017-03-20: qty 2

## 2017-03-20 MED ORDER — GABAPENTIN 300 MG PO CAPS
300.0000 mg | ORAL_CAPSULE | Freq: Every day | ORAL | Status: AC
  Administered 2017-03-20 – 2017-03-22 (×3): 300 mg via ORAL
  Filled 2017-03-20: qty 1
  Filled 2017-03-20: qty 3
  Filled 2017-03-20 (×2): qty 1

## 2017-03-20 MED ORDER — GUAIFENESIN-DM 100-10 MG/5ML PO SYRP: 10.0000 mL | ORAL_SOLUTION | ORAL | Status: DC | PRN

## 2017-03-20 MED ORDER — FENTANYL CITRATE (PF) 250 MCG/5ML IJ SOLN
INTRAMUSCULAR | Status: AC
  Filled 2017-03-20: qty 5

## 2017-03-20 MED ORDER — POTASSIUM CHLORIDE 10 MEQ/100ML IV SOLN
10.0000 meq | INTRAVENOUS | Status: AC
  Administered 2017-03-20 (×4): 10 meq via INTRAVENOUS
  Filled 2017-03-20 (×3): qty 100

## 2017-03-20 MED ORDER — METOCLOPRAMIDE HCL 5 MG/ML IJ SOLN: 5.0000 mg | Freq: Four times a day (QID) | INTRAMUSCULAR | Status: DC | PRN

## 2017-03-20 MED ORDER — MAGIC MOUTHWASH
15.0000 mL | Freq: Four times a day (QID) | ORAL | Status: DC | PRN
Start: 2017-03-20 — End: 2017-03-29
  Filled 2017-03-20: qty 15

## 2017-03-20 MED ORDER — LACTATED RINGERS IV BOLUS (SEPSIS)
1000.0000 mL | Freq: Once | INTRAVENOUS | Status: AC
  Administered 2017-03-20: 1000 mL via INTRAVENOUS

## 2017-03-20 MED ORDER — ROCURONIUM BROMIDE 10 MG/ML (PF) SYRINGE
PREFILLED_SYRINGE | INTRAVENOUS | Status: DC | PRN
  Administered 2017-03-20 (×3): 5 mg via INTRAVENOUS
  Administered 2017-03-20: 20 mg via INTRAVENOUS

## 2017-03-20 MED ORDER — GENTAMICIN SULFATE 40 MG/ML IJ SOLN
5.0000 mg/kg | INTRAVENOUS | Status: AC
  Administered 2017-03-20: 390 mg via INTRAVENOUS
  Filled 2017-03-20: qty 9.75

## 2017-03-20 MED ORDER — IOPAMIDOL (ISOVUE-300) INJECTION 61%
100.0000 mL | Freq: Once | INTRAVENOUS | Status: AC | PRN
  Administered 2017-03-20: 100 mL via INTRAVENOUS

## 2017-03-20 MED ORDER — METRONIDAZOLE IN NACL 5-0.79 MG/ML-% IV SOLN
500.0000 mg | Freq: Once | INTRAVENOUS | Status: DC
  Filled 2017-03-20 (×2): qty 100

## 2017-03-20 MED ORDER — METRONIDAZOLE IN NACL 5-0.79 MG/ML-% IV SOLN
500.0000 mg | Freq: Three times a day (TID) | INTRAVENOUS | Status: AC
  Administered 2017-03-20 – 2017-03-27 (×21): 500 mg via INTRAVENOUS
  Filled 2017-03-20 (×21): qty 100

## 2017-03-20 MED ORDER — HYDRALAZINE HCL 20 MG/ML IJ SOLN: 5.0000 mg | INTRAMUSCULAR | Status: DC | PRN

## 2017-03-20 MED ORDER — METHOCARBAMOL 1000 MG/10ML IJ SOLN
1000.0000 mg | Freq: Four times a day (QID) | INTRAMUSCULAR | Status: DC | PRN
  Filled 2017-03-20: qty 10

## 2017-03-20 MED ORDER — PHENYLEPHRINE 40 MCG/ML (10ML) SYRINGE FOR IV PUSH (FOR BLOOD PRESSURE SUPPORT)
PREFILLED_SYRINGE | INTRAVENOUS | Status: AC
  Filled 2017-03-20: qty 10

## 2017-03-20 MED ORDER — DEXAMETHASONE SODIUM PHOSPHATE 10 MG/ML IJ SOLN
INTRAMUSCULAR | Status: DC | PRN
  Administered 2017-03-20: 5 mg via INTRAVENOUS

## 2017-03-20 MED ORDER — DIPHENHYDRAMINE HCL 12.5 MG/5ML PO ELIX: 12.5000 mg | ORAL_SOLUTION | Freq: Four times a day (QID) | ORAL | Status: DC | PRN

## 2017-03-20 MED ORDER — SUGAMMADEX SODIUM 200 MG/2ML IV SOLN
INTRAVENOUS | Status: DC | PRN
  Administered 2017-03-20: 200 mg via INTRAVENOUS

## 2017-03-20 MED ORDER — ROCURONIUM BROMIDE 50 MG/5ML IV SOSY
PREFILLED_SYRINGE | INTRAVENOUS | Status: AC
  Filled 2017-03-20: qty 10

## 2017-03-20 MED ORDER — ALPRAZOLAM 0.5 MG PO TABS
0.5000 mg | ORAL_TABLET | Freq: Every day | ORAL | Status: DC
  Administered 2017-03-21 – 2017-03-29 (×9): 0.5 mg via ORAL
  Filled 2017-03-20 (×9): qty 1

## 2017-03-20 MED ORDER — HYDROMORPHONE HCL 1 MG/ML IJ SOLN
0.5000 mg | INTRAMUSCULAR | Status: DC | PRN
  Administered 2017-03-21: 1 mg via INTRAVENOUS
  Filled 2017-03-20: qty 1

## 2017-03-20 MED ORDER — INSULIN ASPART 100 UNIT/ML ~~LOC~~ SOLN: 0.0000 [IU] | Freq: Every day | SUBCUTANEOUS | Status: DC

## 2017-03-20 MED ORDER — LACTATED RINGERS IV SOLN
INTRAVENOUS | Status: DC
  Administered 2017-03-20 – 2017-03-25 (×3): via INTRAVENOUS

## 2017-03-20 MED ORDER — ONDANSETRON HCL 4 MG/2ML IJ SOLN
4.0000 mg | Freq: Four times a day (QID) | INTRAMUSCULAR | Status: DC | PRN
  Administered 2017-03-20: 4 mg via INTRAVENOUS
  Filled 2017-03-20: qty 2

## 2017-03-20 MED ORDER — OXYCODONE HCL 5 MG PO TABS: 5.0000 mg | ORAL_TABLET | Freq: Once | ORAL | Status: DC | PRN

## 2017-03-20 MED ORDER — HYDROCORTISONE 2.5 % RE CREA
1.0000 "application " | TOPICAL_CREAM | Freq: Four times a day (QID) | RECTAL | Status: DC | PRN
  Filled 2017-03-20: qty 28.35

## 2017-03-20 MED ORDER — INSULIN ASPART 100 UNIT/ML ~~LOC~~ SOLN
0.0000 [IU] | Freq: Three times a day (TID) | SUBCUTANEOUS | Status: DC
  Administered 2017-03-20: 2 [IU] via SUBCUTANEOUS
  Administered 2017-03-21: 3 [IU] via SUBCUTANEOUS
  Administered 2017-03-21 – 2017-03-22 (×3): 2 [IU] via SUBCUTANEOUS

## 2017-03-20 MED ORDER — ALBUMIN HUMAN 5 % IV SOLN
INTRAVENOUS | Status: DC | PRN
  Administered 2017-03-20: 11:00:00 via INTRAVENOUS

## 2017-03-20 MED ORDER — FLUVOXAMINE MALEATE 50 MG PO TABS: 100.0000 mg | ORAL_TABLET | Freq: Every day | ORAL | Status: DC

## 2017-03-20 MED ORDER — OXYCODONE HCL 5 MG PO TABS: 5.0000 mg | ORAL_TABLET | ORAL | Status: DC | PRN

## 2017-03-20 MED ORDER — SUGAMMADEX SODIUM 200 MG/2ML IV SOLN
INTRAVENOUS | Status: AC
  Filled 2017-03-20: qty 2

## 2017-03-20 MED ORDER — OLANZAPINE 10 MG PO TABS: 10.0000 mg | ORAL_TABLET | Freq: Every day | ORAL | Status: DC

## 2017-03-20 MED ORDER — OLANZAPINE 5 MG PO TABS
2.5000 mg | ORAL_TABLET | Freq: Every day | ORAL | Status: DC
  Administered 2017-03-20 – 2017-03-28 (×9): 2.5 mg via ORAL
  Filled 2017-03-20 (×9): qty 1

## 2017-03-20 MED ORDER — ACETAMINOPHEN 500 MG PO TABS
1000.0000 mg | ORAL_TABLET | ORAL | Status: AC
  Administered 2017-03-20: 1000 mg via ORAL
  Filled 2017-03-20: qty 2

## 2017-03-20 MED ORDER — PROPOFOL 10 MG/ML IV BOLUS
INTRAVENOUS | Status: AC
Start: 2017-03-20 — End: ?
  Filled 2017-03-20: qty 20

## 2017-03-20 MED ORDER — HYDROCORTISONE 1 % EX CREA
1.0000 "application " | TOPICAL_CREAM | Freq: Three times a day (TID) | CUTANEOUS | Status: DC | PRN
  Filled 2017-03-20: qty 28

## 2017-03-20 MED ORDER — ADULT MULTIVITAMIN W/MINERALS CH
1.0000 | ORAL_TABLET | Freq: Every day | ORAL | Status: DC
  Administered 2017-03-21 – 2017-03-29 (×9): 1 via ORAL
  Filled 2017-03-20 (×9): qty 1

## 2017-03-20 MED ORDER — DEXAMETHASONE SODIUM PHOSPHATE 10 MG/ML IJ SOLN
INTRAMUSCULAR | Status: AC
  Filled 2017-03-20: qty 1

## 2017-03-20 MED ORDER — PHENYLEPHRINE 40 MCG/ML (10ML) SYRINGE FOR IV PUSH (FOR BLOOD PRESSURE SUPPORT)
PREFILLED_SYRINGE | INTRAVENOUS | Status: DC | PRN
  Administered 2017-03-20: 200 ug via INTRAVENOUS
  Administered 2017-03-20: 40 ug via INTRAVENOUS
  Administered 2017-03-20 (×2): 200 ug via INTRAVENOUS
  Administered 2017-03-20: 80 ug via INTRAVENOUS
  Administered 2017-03-20: 120 ug via INTRAVENOUS

## 2017-03-20 MED ORDER — LIP MEDEX EX OINT
1.0000 "application " | TOPICAL_OINTMENT | Freq: Two times a day (BID) | CUTANEOUS | Status: DC
  Administered 2017-03-20 – 2017-03-29 (×18): 1 via TOPICAL
  Filled 2017-03-20 (×4): qty 7

## 2017-03-20 MED ORDER — FENTANYL CITRATE (PF) 100 MCG/2ML IJ SOLN: 25.0000 ug | INTRAMUSCULAR | Status: DC | PRN

## 2017-03-20 MED ORDER — OXYCODONE HCL 5 MG/5ML PO SOLN
5.0000 mg | Freq: Once | ORAL | Status: DC | PRN
  Filled 2017-03-20: qty 5

## 2017-03-20 MED ORDER — ENSURE SURGERY PO LIQD
237.0000 mL | Freq: Two times a day (BID) | ORAL | Status: AC
  Administered 2017-03-21 – 2017-03-25 (×7): 237 mL via ORAL
  Filled 2017-03-20 (×14): qty 237

## 2017-03-20 MED ORDER — DIPHENHYDRAMINE HCL 50 MG/ML IJ SOLN: 12.5000 mg | Freq: Four times a day (QID) | INTRAMUSCULAR | Status: DC | PRN

## 2017-03-20 MED ORDER — ENSURE PRE-SURGERY PO LIQD
296.0000 mL | Freq: Once | ORAL | Status: DC
  Filled 2017-03-20: qty 296

## 2017-03-20 MED ORDER — SUCCINYLCHOLINE CHLORIDE 200 MG/10ML IV SOSY
PREFILLED_SYRINGE | INTRAVENOUS | Status: DC | PRN
  Administered 2017-03-20: 100 mg via INTRAVENOUS

## 2017-03-20 MED ORDER — CLINDAMYCIN PHOSPHATE 900 MG/50ML IV SOLN
900.0000 mg | INTRAVENOUS | Status: AC
  Administered 2017-03-20: 900 mg via INTRAVENOUS
  Filled 2017-03-20: qty 50

## 2017-03-20 MED ORDER — MORPHINE SULFATE (PF) 4 MG/ML IV SOLN
4.0000 mg | Freq: Once | INTRAVENOUS | Status: AC
  Administered 2017-03-20: 4 mg via INTRAVENOUS
  Filled 2017-03-20: qty 1

## 2017-03-20 MED ORDER — ENOXAPARIN SODIUM 40 MG/0.4ML ~~LOC~~ SOLN
40.0000 mg | SUBCUTANEOUS | Status: DC
  Administered 2017-03-21 – 2017-03-29 (×9): 40 mg via SUBCUTANEOUS
  Filled 2017-03-20 (×9): qty 0.4

## 2017-03-20 MED ORDER — ONDANSETRON HCL 4 MG/2ML IJ SOLN
INTRAMUSCULAR | Status: DC | PRN
  Administered 2017-03-20: 4 mg via INTRAVENOUS

## 2017-03-20 MED ORDER — ACETAMINOPHEN 650 MG RE SUPP: 650.0000 mg | Freq: Four times a day (QID) | RECTAL | Status: DC | PRN

## 2017-03-20 MED ORDER — BUPIVACAINE HCL (PF) 0.25 % IJ SOLN
INTRAMUSCULAR | Status: DC | PRN
  Administered 2017-03-20: 50 mL

## 2017-03-20 MED ORDER — PHENYLEPHRINE HCL 10 MG/ML IJ SOLN
INTRAVENOUS | Status: DC | PRN
  Administered 2017-03-20: 75 ug/min via INTRAVENOUS

## 2017-03-20 MED ORDER — MIDAZOLAM HCL 2 MG/2ML IJ SOLN
INTRAMUSCULAR | Status: DC | PRN
  Administered 2017-03-20: 1 mg via INTRAVENOUS

## 2017-03-20 MED ORDER — ACETAMINOPHEN 325 MG PO TABS: 650.0000 mg | ORAL_TABLET | Freq: Four times a day (QID) | ORAL | Status: DC | PRN

## 2017-03-20 MED ORDER — LACTATED RINGERS IV BOLUS (SEPSIS): 1000.0000 mL | Freq: Three times a day (TID) | INTRAVENOUS | Status: AC | PRN

## 2017-03-20 MED ORDER — EPHEDRINE 5 MG/ML INJ
INTRAVENOUS | Status: AC
  Filled 2017-03-20: qty 30

## 2017-03-20 MED ORDER — FENTANYL CITRATE (PF) 250 MCG/5ML IJ SOLN
INTRAMUSCULAR | Status: DC | PRN
  Administered 2017-03-20: 25 ug via INTRAVENOUS
  Administered 2017-03-20 (×2): 50 ug via INTRAVENOUS
  Administered 2017-03-20: 25 ug via INTRAVENOUS
  Administered 2017-03-20 (×2): 50 ug via INTRAVENOUS

## 2017-03-20 MED ORDER — PROPOFOL 10 MG/ML IV BOLUS
INTRAVENOUS | Status: DC | PRN
  Administered 2017-03-20: 150 mg via INTRAVENOUS

## 2017-03-20 MED ORDER — BUPIVACAINE HCL (PF) 0.25 % IJ SOLN
INTRAMUSCULAR | Status: AC
  Filled 2017-03-20: qty 60

## 2017-03-20 MED ORDER — VITAMIN D 1000 UNITS PO TABS
1000.0000 [IU] | ORAL_TABLET | Freq: Every day | ORAL | Status: DC
  Administered 2017-03-21 – 2017-03-29 (×9): 1000 [IU] via ORAL
  Filled 2017-03-20 (×10): qty 1

## 2017-03-20 MED ORDER — LIDOCAINE 2% (20 MG/ML) 5 ML SYRINGE
INTRAMUSCULAR | Status: DC | PRN
  Administered 2017-03-20: 1.5 mg/kg/h via INTRAVENOUS

## 2017-03-20 MED ORDER — CEFTRIAXONE SODIUM 1 G IJ SOLR
1.0000 g | Freq: Once | INTRAMUSCULAR | Status: AC
  Administered 2017-03-20: 1 g via INTRAVENOUS
  Filled 2017-03-20: qty 10

## 2017-03-20 MED ORDER — FENTANYL CITRATE (PF) 100 MCG/2ML IJ SOLN
50.0000 ug | INTRAMUSCULAR | Status: DC | PRN
  Administered 2017-03-20: 50 ug via INTRAVENOUS
  Filled 2017-03-20: qty 2

## 2017-03-20 MED ORDER — FA-PYRIDOXINE-CYANOCOBALAMIN 2.5-25-2 MG PO TABS
1.0000 | ORAL_TABLET | Freq: Every day | ORAL | Status: DC
  Administered 2017-03-21 – 2017-03-29 (×9): 1 via ORAL
  Filled 2017-03-20 (×9): qty 1

## 2017-03-20 MED ORDER — KETOROLAC TROMETHAMINE 30 MG/ML IJ SOLN: 15.0000 mg | Freq: Once | INTRAMUSCULAR | Status: DC | PRN

## 2017-03-20 MED ORDER — DONEPEZIL HCL 10 MG PO TABS
5.0000 mg | ORAL_TABLET | Freq: Every day | ORAL | Status: DC
  Administered 2017-03-20 – 2017-03-28 (×9): 5 mg via ORAL
  Filled 2017-03-20 (×9): qty 1

## 2017-03-20 MED ORDER — PROCHLORPERAZINE EDISYLATE 5 MG/ML IJ SOLN: 5.0000 mg | INTRAMUSCULAR | Status: DC | PRN

## 2017-03-20 MED ORDER — BISACODYL 10 MG RE SUPP: 10.0000 mg | Freq: Two times a day (BID) | RECTAL | Status: DC | PRN

## 2017-03-20 MED ORDER — LIDOCAINE 2% (20 MG/ML) 5 ML SYRINGE
INTRAMUSCULAR | Status: AC
  Filled 2017-03-20: qty 25

## 2017-03-20 MED ORDER — METHOCARBAMOL 500 MG PO TABS: 1000.0000 mg | ORAL_TABLET | Freq: Four times a day (QID) | ORAL | Status: DC | PRN

## 2017-03-20 MED ORDER — 0.9 % SODIUM CHLORIDE (POUR BTL) OPTIME
TOPICAL | Status: DC | PRN
  Administered 2017-03-20: 1000 mL

## 2017-03-20 MED ORDER — CALCIUM CARBONATE 1250 (500 CA) MG PO TABS
2.0000 | ORAL_TABLET | Freq: Every day | ORAL | Status: DC
  Administered 2017-03-21 – 2017-03-29 (×9): 1000 mg via ORAL
  Filled 2017-03-20 (×10): qty 1

## 2017-03-20 MED ORDER — SODIUM CHLORIDE 0.9 % IV SOLN
INTRAVENOUS | Status: DC
  Administered 2017-03-20 – 2017-03-21 (×3): via INTRAVENOUS

## 2017-03-20 MED ORDER — GABAPENTIN 300 MG PO CAPS
300.0000 mg | ORAL_CAPSULE | ORAL | Status: AC
  Administered 2017-03-20: 300 mg via ORAL

## 2017-03-20 MED ORDER — PROMETHAZINE HCL 25 MG/ML IJ SOLN: 6.2500 mg | INTRAMUSCULAR | Status: DC | PRN

## 2017-03-20 SURGICAL SUPPLY — 44 items
APPLIER CLIP 5 13 M/L LIGAMAX5 (MISCELLANEOUS) ×3
CABLE HIGH FREQUENCY MONO STRZ (ELECTRODE) ×3 IMPLANT
CHLORAPREP W/TINT 26ML (MISCELLANEOUS) ×3 IMPLANT
CLIP APPLIE 5 13 M/L LIGAMAX5 (MISCELLANEOUS) ×1 IMPLANT
COVER MAYO STAND STRL (DRAPES) ×3 IMPLANT
COVER SURGICAL LIGHT HANDLE (MISCELLANEOUS) ×3 IMPLANT
DECANTER SPIKE VIAL GLASS SM (MISCELLANEOUS) ×3 IMPLANT
DRAIN CHANNEL 19F RND (DRAIN) IMPLANT
DRAIN CHANNEL RND F F (WOUND CARE) ×3 IMPLANT
DRAPE C-ARM 42X120 X-RAY (DRAPES) ×3 IMPLANT
DRAPE WARM FLUID 44X44 (DRAPE) ×3 IMPLANT
DRSG TEGADERM 4X4.75 (GAUZE/BANDAGES/DRESSINGS) ×3 IMPLANT
ELECT REM PT RETURN 15FT ADLT (MISCELLANEOUS) ×3 IMPLANT
ENDOLOOP SUT PDS II  0 18 (SUTURE) ×4
ENDOLOOP SUT PDS II 0 18 (SUTURE) ×2 IMPLANT
EVACUATOR SILICONE 100CC (DRAIN) ×3 IMPLANT
GAUZE SPONGE 2X2 8PLY STRL LF (GAUZE/BANDAGES/DRESSINGS) ×1 IMPLANT
GLOVE ECLIPSE 8.0 STRL XLNG CF (GLOVE) ×3 IMPLANT
GLOVE INDICATOR 8.0 STRL GRN (GLOVE) ×3 IMPLANT
GOWN STRL REUS W/TWL XL LVL3 (GOWN DISPOSABLE) ×6 IMPLANT
HEMOSTAT SURGICEL 2X14 (HEMOSTASIS) ×3 IMPLANT
IRRIG SUCT STRYKERFLOW 2 WTIP (MISCELLANEOUS) ×3
IRRIGATION SUCT STRKRFLW 2 WTP (MISCELLANEOUS) ×1 IMPLANT
KIT BASIN OR (CUSTOM PROCEDURE TRAY) ×3 IMPLANT
PAD POSITIONING PINK XL (MISCELLANEOUS) ×3 IMPLANT
POSITIONER SURGICAL ARM (MISCELLANEOUS) IMPLANT
POUCH RETRIEVAL ECOSAC 10 (ENDOMECHANICALS) ×1 IMPLANT
POUCH RETRIEVAL ECOSAC 10MM (ENDOMECHANICALS) ×2
POUCH SPECIMEN RETRIEVAL 10MM (ENDOMECHANICALS) IMPLANT
SCISSORS METZENBAUM CVD 33 (INSTRUMENTS) ×3 IMPLANT
SET CHOLANGIOGRAPH MIX (MISCELLANEOUS) ×3 IMPLANT
SHEARS HARMONIC ACE PLUS 36CM (ENDOMECHANICALS) ×3 IMPLANT
SPONGE GAUZE 2X2 STER 10/PKG (GAUZE/BANDAGES/DRESSINGS) ×2
SUT MNCRL AB 4-0 PS2 18 (SUTURE) ×3 IMPLANT
SUT PDS AB 1 CT1 27 (SUTURE) ×15 IMPLANT
SUT PDS AB 2-0 CT2 27 (SUTURE) ×3 IMPLANT
SUT PROLENE 2 0 SH DA (SUTURE) ×3 IMPLANT
SYR 20CC LL (SYRINGE) ×3 IMPLANT
TOWEL OR 17X26 10 PK STRL BLUE (TOWEL DISPOSABLE) ×3 IMPLANT
TOWEL OR NON WOVEN STRL DISP B (DISPOSABLE) ×3 IMPLANT
TRAY LAPAROSCOPIC (CUSTOM PROCEDURE TRAY) ×3 IMPLANT
TROCAR BLADELESS OPT 5 100 (ENDOMECHANICALS) ×3 IMPLANT
TROCAR BLADELESS OPT 5 150 (ENDOMECHANICALS) ×3 IMPLANT
TUBING INSUF HEATED (TUBING) ×3 IMPLANT

## 2017-03-20 NOTE — Progress Notes (Signed)
Pre-OP Nursing Note: Pt rec into Preop area from ED, report rec from ED RN, pt on stretcher, sr x 2 up. Pt identified by pt stating name and DOB, arm band also ck'd. Allergy bracelet and Fall Risk bracelet applied. VS obtained. Two IV sites noted, LT AC and RT AC, both WNL, IVF of LR infusing into Lt AC site via gravity. Pt currently denies any pain. Resting comfortably

## 2017-03-20 NOTE — ED Notes (Signed)
Pt in CT.

## 2017-03-20 NOTE — Transfer of Care (Signed)
Immediate Anesthesia Transfer of Care Note  Patient: Leslie Patton  Procedure(s) Performed: LAPAROSCOPIC CHOLECYSTECTOMY (N/A Abdomen)  Patient Location: PACU  Anesthesia Type:General  Level of Consciousness: sedated  Airway & Oxygen Therapy: Patient Spontanous Breathing and Patient connected to face mask oxygen  Post-op Assessment: Report given to RN and Post -op Vital signs reviewed and stable  Post vital signs: Reviewed and stable  Last Vitals:  Vitals:   03/20/17 0215 03/20/17 0857  BP: 139/70 109/73  Pulse: 93 95  Resp:  (!) 22  Temp:    SpO2: 95% 95%    Last Pain:  Vitals:   03/20/17 0857  TempSrc:   PainSc: 0-No pain         Complications: No apparent anesthesia complications

## 2017-03-20 NOTE — ED Notes (Signed)
Pt void 500 cc darker urine at 6:10 am ( before surgery )

## 2017-03-20 NOTE — H&P (Addendum)
[facility]  [address] [place], [place] [address] Phone: [phone] FAX: [phone]     Leslie Patton  Oct 19, 1945 [id number]  CARE TEAM:  PCP: Cari Caraway, MD  Outpatient Care Team: Patient Care Team: Cari Caraway, MD as PCP - General (Family Medicine)  Inpatient Treatment Team: Treatment Team: Attending Provider: Delora Fuel, MD; Consulting Physician: Edison Pace, Md, MD   This patient is a 72 y.o.female who presents today for surgical evaluation at the request of Pryor Curia, [place] [facility].   Chief complaint / Reason for evaluation: Right upper quadrant abdominal pain and cholecystitis  72 year old female with right upper quadrant abdominal pain rather severe starting yesterday morning.  Persistent.  Decreased appetite.  Progressively worsened through the day.  Unbearable.  Went to the emergency room at [facility].  Exam and CT scan findings suspicious for cholecystitis.  Surgical consultation recommended.  Transferred to [facility] hospital.  Husband at bedside.  Patient had some loose bowel movements the day before.  She and her husband presumed it was due to a gastroenteritis "flu bug that's been going around."  She normally can walk at least 1/2-hour without difficulty.  Usually moves her bowels pretty regularly.  Had a tubal ligation but no other abdominal surgeries.  Denies any hematochezia or hemoptysis.  Has a hiatal hernia but no severe heartburn or reflux.  No dysphagia.  No hematemesis.  No personal nor family history of GI/colon cancer, inflammatory bowel disease, irritable bowel syndrome, allergy such as Celiac Sprue, dietary/dairy problems, colitis, ulcers nor gastritis.  No recent sick contacts.  No travel outside the country.  No changes in diet.  No dysphagia to solids or liquids.  No significant heartburn or reflux.  No hematochezia, hematemesis, coffee ground emesis.  No evidence of prior  gastric/peptic ulceration.    Assessment  Leslie Patton  72 y.o. female       Problem List:  Principal Problem:   Acute cholecystitis with chronic cholecystitis Active Problems:   OSA (obstructive sleep apnea)   Gait difficulty   Risk for falls   Anxiety   OCD (obsessive compulsive disorder)   Hypertension   History physical and CT scan findings suspicious for acute cholecystitis  Plan:  Admit.  IV fluids.  IV antibiotics.  Penicillin allergy =  Cipro and Flagyl.  Hypokalemia.  Correct & check Mg  Pain control.  Nausea control.  Laparoscopic cholecystectomy.  Probable cholangiogram given mildly elevated liver function tests.  The anatomy & physiology of hepatobiliary & pancreatic function was discussed.  The pathophysiology of gallbladder dysfunction was discussed.  Natural history risks without surgery was discussed.   I feel the risks of no intervention will lead to serious problems that outweigh the operative risks; therefore, I recommended cholecystectomy to remove the pathology.  I explained laparoscopic techniques with possible need for an open approach.  Probable cholangiogram to evaluate the bilary tract was explained as well.    Risks such as bleeding, infection, abscess, leak, injury to other organs, need for repair of tissues / organs, need for further treatment, stroke, heart attack, death, and other risks were discussed.  I noted a good likelihood this will help address the problem.  Possibility that this will not correct all abdominal symptoms was explained.  Goals of post-operative recovery were discussed as well.  We will work to minimize complications.  An educational handout further explaining the pathology and treatment options was given as well.  Questions were answered.  The patient expresses understanding & wishes to proceed with surgery.   Hypertension control.  Baseline psychiatric meds for OCD and anxiety  Nightly oxygen given personal  history of hypoxia and possible sleep apnea.  May switch to CPAP if he uses at home.  Respiratory therapy supervision PRN.  -VTE prophylaxis- SCDs, etc  -mobilize as tolerated to help recovery  20 history of nocturnal hypoxia and possible sleep apnea.  May switch over to CPAP machine with settings home settings under respiratory therapy supervision.  Questionable history of nocturnal hypoxia and sleep apnea.  May switch to CPAP per home settings under respiratory therapy supervision.  Nocturnal hypoxia and possible sleep apnea.  May start CPAP per respiratory therapy supervision minutes spent in review, evaluation, examination, counseling, and coordination of care.  More than 50% of that time was spent in counseling.  Adin Hector, M.D., F.A.C.S. Gastrointestinal and Minimally Invasive Surgery [facility] [address], [place], [place] [address] [phone] Main / Paging   03/20/2017      Past Medical History:  Diagnosis Date  . Anxiety   . Hypertension   . OCD (obsessive compulsive disorder)   . Vitamin D deficiency     Past Surgical History:  Procedure Laterality Date  . TUBAL LIGATION      Social History   Socioeconomic History  . Marital status: Married    Spouse name: Not on file  . Number of children: Not on file  . Years of education: Not on file  . Highest education level: Not on file  Social Needs  . Financial resource strain: Not on file  . Food insecurity - worry: Not on file  . Food insecurity - inability: Not on file  . Transportation needs - medical: Not on file  . Transportation needs - non-medical: Not on file  Occupational History  . Not on file  Tobacco Use  . Smoking status: Former Research scientist (life sciences)  . Smokeless tobacco: Never Used  Substance and Sexual Activity  . Alcohol use: Yes  . Drug use: No  . Sexual activity: Not on file  Other Topics Concern  . Not on file  Social History Narrative  . Not on file     History reviewed. No pertinent family history.  Current Facility-Administered Medications  Medication Dose Route Frequency Provider Last Rate Last Dose  . 0.9 %  sodium chloride infusion   Intravenous Continuous Ward, Kristen N, DO 75 mL/hr at 03/20/17 0149    . acetaminophen (TYLENOL) tablet 1,000 mg  1,000 mg Oral On Call to OR Michael Boston, MD      . Chlorhexidine Gluconate Cloth 2 % PADS 6 each  6 each Topical Once Michael Boston, MD       And  . Chlorhexidine Gluconate Cloth 2 % PADS 6 each  6 each Topical Once Michael Boston, MD      . clindamycin (CLEOCIN) IVPB 900 mg  900 mg Intravenous On Call to OR Michael Boston, MD       And  . gentamicin (GARAMYCIN) 390 mg in dextrose 5 % 100 mL IVPB  5 mg/kg Intravenous On Call to OR Michael Boston, MD      . fentaNYL (SUBLIMAZE) injection 50 mcg  50 mcg Intravenous Q1H PRN Ward, Kristen N, DO      . gabapentin (NEURONTIN) capsule 300 mg  300 mg Oral On Call to OR Michael Boston, MD      . metroNIDAZOLE (  FLAGYL) IVPB 500 mg  500 mg Intravenous Once Ward, Kristen N, DO      . ondansetron (ZOFRAN) injection 4 mg  4 mg Intravenous Q6H PRN Ward, Kristen N, DO   4 mg at 03/20/17 0151  . potassium chloride 10 mEq in 100 mL IVPB  10 mEq Intravenous Q1 Hr x 4 Raymondo Garcialopez, Remo Lipps, MD       Current Outpatient Medications  Medication Sig Dispense Refill  . cholecalciferol (VITAMIN D) 1000 UNITS tablet Take 1,000 Units by mouth daily.    . fluvoxaMINE (LUVOX) 100 MG tablet Take 100 mg by mouth at bedtime.    . hydrochlorothiazide (HYDRODIURIL) 25 MG tablet Take 25 mg by mouth daily.    . Multiple Vitamins-Minerals (CENTRUM SILVER) tablet Take 1 tablet by mouth daily.    Marland Kitchen OLANZapine (ZYPREXA) 10 MG tablet Take 10 mg by mouth at bedtime.       Allergies  Allergen Reactions  . Penicillins Hives    ROS:   All other systems reviewed & are negative except per HPI or as noted below: Constitutional:  No fevers, chills, sweats.  Weight stable Eyes:  No vision  changes, No discharge HENT:  No sore throats, nasal drainage Lymph: No neck swelling, No bruising easily Pulmonary:  No cough, productive sputum CV: No orthopnea, PND  Patient walks 20 minutes for about 0.5 miles without difficulty.  No exertional chest/neck/shoulder/arm pain. GI: No personal nor family history of GI/colon cancer, inflammatory bowel disease, irritable bowel syndrome, allergy such as Celiac Sprue, dietary/dairy problems, colitis, ulcers nor gastritis.  No recent sick contacts.  No travel outside the country.  No changes in diet. Renal: No UTIs, No hematuria Genital:  No drainage, bleeding, masses Musculoskeletal: No severe joint pain.  Good ROM major joints Skin:  No sores or lesions.  No rashes Heme/Lymph:  No easy bleeding.  No swollen lymph nodes Neuro: No focal weakness/numbness.  No seizures Psych: No suicidal ideation.  No hallucinations  BP 139/70   Pulse 93   Temp 98 F (36.7 C) (Oral)   Resp (!) 26   Ht '5\' 4"'$  (1.626 m)   Wt 77.1 kg (170 lb)   SpO2 95%   BMI 29.18 kg/m   Physical Exam: General: Pt awake/alert/oriented x4 in mild acute distress Eyes: PERRL, normal EOM. Sclera nonicteric Neuro: CN II-XII intact w/o focal sensory/motor deficits. Lymph: No head/neck/groin lymphadenopathy Psych:  No delerium/psychosis/paranoia.  Anxious.  Somewhat consolable HENT: Normocephalic, Mucus membranes moist.  No thrush Neck: Supple, No tracheal deviation Chest: No pain.  Good respiratory excursion. CV:  Pulses intact.  Regular rhythm Abdomen: Soft, Nondistended.  Very tender in right upper quadrant with Murphy sign. The rest of the abdomen is soft and nontender.  No incarcerated hernias. Gen:  No inguinal hernias.  No inguinal lymphadenopathy.   Ext:  SCDs BLE.  No significant edema.  No cyanosis Skin: No petechiae / purpurea.  No major sores Musculoskeletal: No severe joint pain.  Good ROM major joints   Results:   Labs: Results for orders placed or performed  during the hospital encounter of 03/19/17 (from the past 48 hour(s))  Urinalysis, Routine w reflex microscopic     Status: Abnormal   Collection Time: 03/19/17 11:00 PM  Result Value Ref Range   Color, Urine YELLOW YELLOW   APPearance CLEAR CLEAR   Specific Gravity, Urine 1.010 1.005 - 1.030   pH 6.0 5.0 - 8.0   Glucose, UA NEGATIVE NEGATIVE mg/dL   Hgb  urine dipstick TRACE (A) NEGATIVE   Bilirubin Urine NEGATIVE NEGATIVE   Ketones, ur NEGATIVE NEGATIVE mg/dL   Protein, ur NEGATIVE NEGATIVE mg/dL   Nitrite POSITIVE (A) NEGATIVE   Leukocytes, UA TRACE (A) NEGATIVE  Urinalysis, Microscopic (reflex)     Status: Abnormal   Collection Time: 03/19/17 11:00 PM  Result Value Ref Range   RBC / HPF 0-5 0 - 5 RBC/hpf   WBC, UA 0-5 0 - 5 WBC/hpf   Bacteria, UA MANY (A) NONE SEEN   Squamous Epithelial / LPF 0-5 (A) NONE SEEN  CBC with Differential     Status: Abnormal   Collection Time: 03/19/17 11:30 PM  Result Value Ref Range   WBC 14.5 (H) 4.0 - 10.5 K/uL   RBC 4.40 3.87 - 5.11 MIL/uL   Hemoglobin 11.9 (L) 12.0 - 15.0 g/dL   HCT 37.8 36.0 - 46.0 %   MCV 85.9 78.0 - 100.0 fL   MCH 27.0 26.0 - 34.0 pg   MCHC 31.5 30.0 - 36.0 g/dL   RDW 15.8 (H) 11.5 - 15.5 %   Platelets 198 150 - 400 K/uL   Neutrophils Relative % 87 %   Neutro Abs 12.5 (H) 1.7 - 7.7 K/uL   Lymphocytes Relative 8 %   Lymphs Abs 1.2 0.7 - 4.0 K/uL   Monocytes Relative 5 %   Monocytes Absolute 0.8 0.1 - 1.0 K/uL   Eosinophils Relative 0 %   Eosinophils Absolute 0.0 0.0 - 0.7 K/uL   Basophils Relative 0 %   Basophils Absolute 0.0 0.0 - 0.1 K/uL  Comprehensive metabolic panel     Status: Abnormal   Collection Time: 03/19/17 11:30 PM  Result Value Ref Range   Sodium 135 135 - 145 mmol/L   Potassium 3.2 (L) 3.5 - 5.1 mmol/L   Chloride 99 (L) 101 - 111 mmol/L   CO2 25 22 - 32 mmol/L   Glucose, Bld 169 (H) 65 - 99 mg/dL   BUN 17 6 - 20 mg/dL   Creatinine, Ser 0.82 0.44 - 1.00 mg/dL   Calcium 8.6 (L) 8.9 - 10.3 mg/dL    Total Protein 7.1 6.5 - 8.1 g/dL   Albumin 3.7 3.5 - 5.0 g/dL   AST 46 (H) 15 - 41 U/L   ALT 40 14 - 54 U/L   Alkaline Phosphatase 109 38 - 126 U/L   Total Bilirubin 0.8 0.3 - 1.2 mg/dL   GFR calc non Af Amer >60 >60 mL/min   GFR calc Af Amer >60 >60 mL/min    Comment: (NOTE) The eGFR has been calculated using the CKD EPI equation. This calculation has not been validated in all clinical situations. eGFR's persistently <60 mL/min signify possible Chronic Kidney Disease.    Anion gap 11 5 - 15  Lipase, blood     Status: None   Collection Time: 03/19/17 11:30 PM  Result Value Ref Range   Lipase 47 11 - 51 U/L    Imaging / Studies: Ct Abdomen Pelvis W Contrast  Result Date: 03/20/2017 CLINICAL DATA:  72 year old female with right upper quadrant abdominal pain. EXAM: CT ABDOMEN AND PELVIS WITH CONTRAST TECHNIQUE: Multidetector CT imaging of the abdomen and pelvis was performed using the standard protocol following bolus administration of intravenous contrast. CONTRAST:  153m ISOVUE-300 IOPAMIDOL (ISOVUE-300) INJECTION 61% COMPARISON:  None. FINDINGS: Lower chest: There are bibasilar subsegmental atelectatic changes. No intra-free air. There is trace free fluid within the pelvis as well as small perihepatic ascites. Hepatobiliary: The  gallbladder is distended. There is mild inflammatory changes of the gallbladder wall and pericholecystic fluid. No calcified gallstone identified. There is a focal area of irregularity of the gallbladder wall with apparent discontinuity in the gallbladder mucosa (series 2, image 37) involving the body of the gallbladder. A 1.6 x 0.5 cm fluid in the periphery of the gallbladder may represent small pericholecystic fluid versus wall edema or focally contained collection secondary to gallbladder perforation. Further evaluation with ultrasound recommended. There is mild dilatation of the common bile duct and intrahepatic biliary tree. Subcentimeter right hepatic  hypodense lesion is too small to characterize. The liver is otherwise unremarkable. Pancreas: Unremarkable. No pancreatic ductal dilatation or surrounding inflammatory changes. Spleen: Normal in size without focal abnormality. Adrenals/Urinary Tract: The adrenal glands are unremarkable. There is a 1 cm right renal cyst. There is no hydronephrosis on either side. There is symmetric enhancement and excretion of contrast by both kidneys. Small bilateral renal parapelvic cysts noted. The visualized ureters and urinary bladder appear unremarkable. Stomach/Bowel: There is a moderate size hiatal hernia containing a portion of the stomach with organo-axial volvulus. No evidence of gastric outlet obstruction. There is sigmoid diverticulosis without active inflammatory changes. No bowel obstruction. The appendix is not visualized with certainty. No inflammatory changes identified in the right lower quadrant. Vascular/Lymphatic: No significant vascular findings are present. No enlarged abdominal or pelvic lymph nodes. Reproductive: The uterus and ovaries are grossly unremarkable. No pelvic mass. Other: None Musculoskeletal: Mild degenerative changes of the spine. No acute osseous pathology. IMPRESSION: 1. Inflammatory changes of the gallbladder most consistent with acute cholecystitis. No calcified gallstone identified. Further evaluation with ultrasound recommended. An area of apparent irregularity of the gallbladder wall with mild small amount of adjacent fluid along the body of the gallbladder may represent edema within the gallbladder. However, focal gallbladder perforation is not entirely excluded. 2. Mild dilatation of the intrahepatic biliary tree and CBD. No calcified stone identified in the central CBD. 3. Moderate hiatal hernia containing portion of the stomach. No bowel obstruction. 4. Scattered sigmoid diverticula. Electronically Signed   By: Anner Crete M.D.   On: 03/20/2017 01:01    Medications /  Allergies: per chart  Antibiotics: Anti-infectives (From admission, onward)   Start     Dose/Rate Route Frequency Ordered Stop   03/20/17 0600  clindamycin (CLEOCIN) IVPB 900 mg     900 mg 100 mL/hr over 30 Minutes Intravenous On call to O.R. 03/20/17 4540 03/21/17 0559   03/20/17 0600  gentamicin (GARAMYCIN) 390 mg in dextrose 5 % 100 mL IVPB     5 mg/kg  77.1 kg 109.8 mL/hr over 60 Minutes Intravenous On call to O.R. 03/20/17 9811 03/21/17 0559   03/20/17 0115  cefTRIAXone (ROCEPHIN) 1 g in dextrose 5 % 50 mL IVPB     1 g 100 mL/hr over 30 Minutes Intravenous  Once 03/20/17 0110 03/20/17 0224   03/20/17 0115  metroNIDAZOLE (FLAGYL) IVPB 500 mg     500 mg 100 mL/hr over 60 Minutes Intravenous  Once 03/20/17 0110          Note: Portions of this report may have been transcribed using voice recognition software. Every effort was made to ensure accuracy; however, inadvertent computerized transcription errors may be present.   Any transcriptional errors that result from this process are unintentional.    Adin Hector, M.D., F.A.C.S. Gastrointestinal and Minimally Invasive Surgery [facility] [address] [place], [place] [address] [phone] Main /  Paging   03/20/2017

## 2017-03-20 NOTE — ED Notes (Signed)
Back from CT, no changes.  

## 2017-03-20 NOTE — Discharge Instructions (Signed)
DRAIN CARE:   You have a closed bulb drain to help you heal.    A bulb drain is a small, plastic reservoir which creates a gentle suction. It is used to remove excess fluid from a surgical wound. The color and amount of fluid will vary. Immediately after surgery, the fluid is bright red. It may gradually change to a yellow color. When the amount decreases to about 1 or 2 tablespoons (15 to 30 cc) per 24 hours, your caregiver will usually remove it.  JP Care  The Jackson-Pratt drainage system has flexible tubing attached to a soft, plastic bulb with a stopper. The drainage end of the tubing, which is flat and white, goes into your body through a small opening near your incision (surgical cut). A stitch holds the drainage end in place. The rest of the tube is outside your body, attached to the bulb. When the bulb is compressed with the stopper in place, it creates a vacuum. This causes a constant gentle suction, which helps draw out fluid that collects under your incision. The bulb should be compressed at all times, except when you are emptying the drainage.  How long you will have your Jackson-Pratt depends on your surgery and the amount of fluid is draining. This is different for everyone. The Jackson-Pratt is usually removed when the drainage is 30 mL or less over 24 hours. To keep track of how much drainage youre having, you will record the amount in a drainage log. Its important to bring the log with you to your follow-up appointments.  Caring for Your Jackson-Pratt at Home In order to care for your Jackson-Pratt at home, you or your caregiver will do the following:  Empty the drain once a day and record the color and amount of drainage  Care for the area where the tubing enters your skin by washing with soap and water.  Milk the tubing to help move clots into the bulb.  Do this before you empty and measure your drainage. Look in the mirror at the tubing. This will help you see where your  hands need to be. Pinch the tubing close to where it goes into your skin between your thumb and forefinger. With the thumb and forefinger of your other hand, pinch the tubing right below your other fingers. Keep your fingers pinched and slide them down the tubing, pushing any clots down toward the bulb. You may want to use alcohol swabs to help you slide your fingers down the tubing. Repeat steps 3 and 4 as necessary to push clots from the tubing into the bulb. If you are not able to move a clot into the bulb, call your doctors office. The fluid may leak around the insertion site if a clot is blocking the drainage flow. If there is fluid in the bulb and no leakage at the insertion site, the drain is working.  How to Empty Your Jackson-Pratt and Record the Drainage You will need to empty your Jackson-Pratt every day  Gather the following supplies:  Measuring container your nurse gave you Jackson-Pratt Drainage Record  Pen or pencil  Instructions Clean an area to work on. Clean your hands thoroughly. Unplug the stopper on top of your Jackson-Pratt. This will cause the bulb to expand. Do not touch the inside of the stopper or the inner area of the opening on the bulb. Turn your Jackson-Pratt upside down, gently squeeze the bulb, and pour the drainage into the measuring container. Turn your Jackson-Pratt right  side up. Squeeze the bulb until your fingers feel the palm of your hand. Keep squeezing the bulb while you replug the stopper. Make sure the bulb stays fully compressed to ensure constant, gentle suction.    Check the amount and color of drainage in the measuring container. The first couple days after surgery the fluid may be dark red. This is normal. As you heal the fluid may look pink or pale yellow. Record this amount and the color of drainage on your Jackson-Pratt Drainage Record. Flush the drainage down the toilet and rinse the measuring container with water.  Caring for the  Insertion Site  Once you have emptied the drainage, clean your hands again. Check the area around the insertion site. Look for tenderness, swelling, or pus. If you have any of these, or if you have a temperature of 101 F (38.3 C) or higher, you may have an infection. Call your doctors office.  Sometimes, the drain causes redness the size of a dime at your insertion site. This is normal. Your healthcare provider will tell you if you should place a bandage over the insertion site.  Wash drain site with soap & water (dilute hydrogen peroxide PRN) daily & replace clean dressing / tape    DAILY CARE  Keep the bulb compressed at all times, except while emptying it. The compression creates suction.   Keep sites where the tubes enter the skin dry and covered with a light bandage (dressing).   Tape the tubes to your skin, 1 to 2 inches below the insertion sites, to keep from pulling on your stitches. Tubes are stitched in place and will not slip out.   Pin the bulb to your shirt (not to your pants) with a safety pin.   For the first few days after surgery, there usually is more fluid in the bulb. Empty the bulb whenever it becomes half full because the bulb does not create enough suction if it is too full. Include this amount in your 24 hour totals.   When the amount of drainage decreases, empty the bulb at the same time every day. Write down the amounts and the 24 hour totals. Your caregiver will want to know them. This helps your caregiver know when the tubes can be removed.   (We anticipate removing the drain in 1-3 weeks, depending on when the output is <12m a day for 2+ days)  If there is drainage around the tube sites, change dressings and keep the area dry. If you see a clot in the tube, leave it alone. However, if the tube does not appear to be draining, let your caregiver know.  TO EMPTY THE BULB  Open the stopper to release suction.   Holding the stopper out of the way, pour  drainage into the measuring cup that was sent home with you.   Measure and write down the amount. If there are 2 bulbs, note the amount of drainage from bulb 1 or bulb 2 and keep the totals separate. Your caregiver will want to know which tube is draining more.   Compress the bulb by folding it in half.   Replace the stopper.   Check the tape that holds the tube to your skin, and pin the bulb to your shirt.  SEEK MEDICAL CARE IF:  The drainage develops a bad odor.   You have an oral temperature above 102 F (38.9 C).   The amount of drainage from your wound suddenly increases or decreases.  You accidentally pull out your drain.   You have any other questions or concerns.  MAKE SURE YOU:   Understand these instructions.   Will watch your condition.   Will get help right away if you are not doing well or get worse.     Call our office if you have any questions about your drain. [phone]  LAPAROSCOPIC SURGERY: POST OP INSTRUCTIONS  ######################################################################  EAT Gradually transition to a high fiber diet with a fiber supplement over the next few weeks after discharge.  Start with a pureed / full liquid diet (see below)  WALK Walk an hour a day.  Control your pain to do that.    CONTROL PAIN Control pain so that you can walk, sleep, tolerate sneezing/coughing, go up/down stairs.  HAVE A BOWEL MOVEMENT DAILY Keep your bowels regular to avoid problems.  OK to try a laxative to override constipation.  OK to use an antidairrheal to slow down diarrhea.  Call if not better after 2 tries  CALL IF YOU HAVE PROBLEMS/CONCERNS Call if you are still struggling despite following these instructions. Call if you have concerns not answered by these instructions  ######################################################################    1. DIET: Follow a light bland diet the first 24 hours after arrival home, such as soup, liquids,  crackers, etc.  Be sure to include lots of fluids daily.  Avoid fast food or heavy meals as your are more likely to get nauseated.  Eat a low fat the next few days after surgery.   2. Take your usually prescribed home medications unless otherwise directed. 3. PAIN CONTROL: a. Pain is best controlled by a usual combination of three different methods TOGETHER: i. Ice/Heat ii. Over the counter pain medication iii. Prescription pain medication b. Most patients will experience some swelling and bruising around the incisions.  Ice packs or heating pads (30-60 minutes up to 6 times a day) will help. Use ice for the first few days to help decrease swelling and bruising, then switch to heat to help relax tight/sore spots and speed recovery.  Some people prefer to use ice alone, heat alone, alternating between ice & heat.  Experiment to what works for you.  Swelling and bruising can take several weeks to resolve.   c. It is helpful to take an over-the-counter pain medication regularly for the first few weeks.  Choose one of the following that works best for you: i. Naproxen (Aleve, etc)  Two 220mg  tabs twice a day ii. Ibuprofen (Advil, etc) Three 200mg  tabs four times a day (every meal & bedtime) iii. Acetaminophen (Tylenol, etc) 500-650mg  four times a day (every meal & bedtime) d. A  prescription for pain medication (such as oxycodone, hydrocodone, etc) should be given to you upon discharge.  Take your pain medication as prescribed.  i. If you are having problems/concerns with the prescription medicine (does not control pain, nausea, vomiting, rash, itching, etc), please call us [phone] to see if we need to switch you to a different pain medicine that will work better for you and/or control your side effect better. ii. If you need a refill on your pain medication, please contact your pharmacy.  They will contact our office to request authorization. Prescriptions will not be filled after 5 pm or on  week-ends. 4. Avoid getting constipated.  Between the surgery and the pain medications, it is common to experience some constipation.  Increasing fluid intake and taking a fiber supplement (such as Metamucil, Citrucel, FiberCon, MiraLax, etc)  etc) 1-2 times a day regularly will usually help prevent this problem from occurring.  A mild laxative (prune juice, Milk of Magnesia, MiraLax, etc) should be taken according to package directions if there are no bowel movements after 48 hours.   °5. Watch out for diarrhea.  If you have many loose bowel movements, simplify your diet to bland foods & liquids for a few days.  Stop any stool softeners and decrease your fiber supplement.  Switching to mild anti-diarrheal medications (Kayopectate, Pepto Bismol) can help.  If this worsens or does not improve, please call us. °6. Wash / shower every day.  You may shower over the dressings as they are waterproof.  Continue to shower over incision(s) after the dressing is off. °7. Remove your waterproof bandages 5 days after surgery.  You may leave the incision open to air.  You may replace a dressing/Band-Aid to cover the incision for comfort if you wish.  °8. ACTIVITIES as tolerated:   °a. You may resume regular (light) daily activities beginning the next day--such as daily self-care, walking, climbing stairs--gradually increasing activities as tolerated.  If you can walk 30 minutes without difficulty, it is safe to try more intense activity such as jogging, treadmill, bicycling, low-impact aerobics, swimming, etc. °b. Save the most intensive and strenuous activity for last such as sit-ups, heavy lifting, contact sports, etc  Refrain from any heavy lifting or straining until you are off narcotics for pain control.   °c. DO NOT PUSH THROUGH PAIN.  Let pain be your guide: If it hurts to do something, don't do it.  Pain is your body warning you to avoid that activity for another week until the pain goes down. °d. You may drive when you are  no longer taking prescription pain medication, you can comfortably wear a seatbelt, and you can safely maneuver your car and apply brakes. °e. You may have sexual intercourse when it is comfortable.  °9. FOLLOW UP in our office °a. Please call [facility] at [phone] to set up an appointment to see your surgeon in the office for a follow-up appointment approximately 2-3 weeks after your surgery. °b. Make sure that you call for this appointment the day you arrive home to insure a convenient appointment time. °10. IF YOU HAVE DISABILITY OR FAMILY LEAVE FORMS, BRING THEM TO THE OFFICE FOR PROCESSING.  DO NOT GIVE THEM TO YOUR DOCTOR. ° ° °WHEN TO CALL US [phone]: °1. Poor pain control °2. Reactions / problems with new medications (rash/itching, nausea, etc)  °3. Fever over 101.5 F (38.5 C) °4. Inability to urinate °5. Nausea and/or vomiting °6. Worsening swelling or bruising °7. Continued bleeding from incision. °8. Increased pain, redness, or drainage from the incision ° ° The clinic staff is available to answer your questions during regular business hours (8:30am-5pm).  Please don’t hesitate to call and ask to speak to one of our nurses for clinical concerns.  ° If you have a medical emergency, go to the nearest emergency room or call 911. ° A surgeon from [facility] is always on call at the hospitals ° ° °[facility] °[address], [address], , [place]  [address] ? °MAIN: [phone] ? TOLL FREE: [phone] ?  °FAX [phone] °[email or web address] ° ° °Cholecystitis °Cholecystitis is inflammation of the gallbladder. It is often called a gallbladder attack. The gallbladder is a pear-shaped organ that lies beneath the liver on the right side of the body. The gallbladder   stores bile, which is a fluid that helps the body to digest fats. If bile builds up in your gallbladder, your gallbladder becomes inflamed. This condition may occur suddenly (be  acute). Repeat episodes of acute cholecystitis or prolonged episodes may lead to a long-term (chronic) condition. Cholecystitis is serious and it requires treatment. °What are the causes? °The most common cause of this condition is gallstones. Gallstones can block the tube (duct) that carries bile out of your gallbladder. This causes bile to build up. Other causes of this condition include: °· Damage to the gallbladder due to a decrease in blood flow. °· Infections in the bile ducts. °· Scars or kinks in the bile ducts. °· Tumors in the liver, pancreas, or gallbladder. ° °What increases the risk? °This condition is more likely to develop in: °· People who have sickle cell disease. °· People who take birth control pills or use estrogen. °· People who have alcoholic liver disease. °· People who have liver cirrhosis. °· People who have their nutrition delivered through a vein (parenteral nutrition). °· People who do not eat or drink (do fasting) for a long period of time. °· People who are obese. °· People who have rapid weight loss. °· People who are pregnant. °· People who have increased triglyceride levels. °· People who have pancreatitis. ° °What are the signs or symptoms? °Symptoms of this condition include: °· Abdominal pain, especially in the upper right area of the abdomen. °· Abdominal tenderness or bloating. °· Nausea. °· Vomiting. °· Fever. °· Chills. °· Yellowing of the skin and the whites of the eyes (jaundice). ° °How is this diagnosed? °This condition is diagnosed with a medical history and physical exam. You may also have other tests, including: °· Imaging tests, such as: °? An ultrasound of the gallbladder. °? A CT scan of the abdomen. °? A gallbladder nuclear scan (HIDA scan). This scan allows your health care provider to see the bile moving from your liver to your gallbladder and to your small intestine. °? MRI. °· Blood tests, such as: °? A complete blood count, because the white blood cell count  may be higher than normal. °? Liver function tests, because some levels may be higher than normal with certain types of gallstones. ° °How is this treated? °Treatment may include: °· Fasting for a certain amount of time. °· IV fluids. °· Medicine to treat pain or vomiting. °· Antibiotic medicine. °· Surgery to remove your gallbladder (cholecystectomy). This may happen immediately or at a later time. ° °Follow these instructions at home: °Home care will depend on your treatment. In general: °· Take over-the-counter and prescription medicines only as told by your health care provider. °· If you were prescribed an antibiotic medicine, take it as told by your health care provider. Do not stop taking the antibiotic even if you start to feel better. °· Follow instructions from your health care provider about what to eat or drink. When you are allowed to eat, avoid eating or drinking anything that triggers your symptoms. °· Keep all follow-up visits as told by your health care provider. This is important. ° °Contact a health care provider if: °· Your pain is not controlled with medicine. °· You have a fever. °Get help right away if: °· Your pain moves to another part of your abdomen or to your back. °· You continue to have symptoms or you develop new symptoms even with treatment. °This information is not intended to replace advice given to you by   by your health care provider. Make sure you discuss any questions you have with your health care provider. Document Released: 02/16/2005 Document Revised: 06/27/2015 Document Reviewed: 05/30/2014 Elsevier Interactive Patient Education  2018 [employer]

## 2017-03-20 NOTE — ED Notes (Signed)
[facility], alert, NAD, calm, no emesis, denies nausea, "pain letting up", VSS[facility]

## 2017-03-20 NOTE — ED Notes (Signed)
[facility] Report given to [facility]. Out with [facility]. No changes, remains alert, NAD, calm, interactive, ABT Rocephin infusing.

## 2017-03-20 NOTE — Anesthesia Postprocedure Evaluation (Signed)
Anesthesia Post Note  Patient: Leslie Patton  Procedure(s) Performed: LAPAROSCOPIC CHOLECYSTECTOMY (N/A Abdomen)     Patient location during evaluation: PACU Anesthesia Type: General Level of consciousness: awake and alert Pain management: pain level controlled Vital Signs Assessment: post-procedure vital signs reviewed and stable Respiratory status: spontaneous breathing, nonlabored ventilation, respiratory function stable and patient connected to nasal cannula oxygen Cardiovascular status: blood pressure returned to baseline and stable Postop Assessment: no apparent nausea or vomiting Anesthetic complications: no    Last Vitals:  Vitals:   03/20/17 1345 03/20/17 1400  BP: 120/78 118/82  Pulse: 90 90  Resp: (!) 8 14  Temp:  36.6 C  SpO2: 99% 96%    Last Pain:  Vitals:   03/20/17 0857  TempSrc:   PainSc: 0-No pain                 Enedelia Martorelli S

## 2017-03-20 NOTE — Anesthesia Procedure Notes (Signed)
Procedure Name: Intubation Date/Time: 03/20/2017 10:27 AM Performed by: Minerva EndsMirarchi, Chiante Peden M, CRNA Pre-anesthesia Checklist: Patient identified, Emergency Drugs available, Suction available and Patient being monitored Patient Re-evaluated:Patient Re-evaluated prior to induction Oxygen Delivery Method: Circle System Utilized Preoxygenation: Pre-oxygenation with 100% oxygen Induction Type: IV induction, Rapid sequence and Cricoid Pressure applied Ventilation: Mask ventilation without difficulty Laryngoscope Size: Miller and 2 Grade View: Grade II Tube type: Oral Tube size: 7.0 mm Number of attempts: 1 Airway Equipment and Method: Stylet Placement Confirmation: ETT inserted through vocal cords under direct vision,  positive ETCO2 and breath sounds checked- equal and bilateral Tube secured with: Tape Dental Injury: Teeth and Oropharynx as per pre-operative assessment  Comments: Smooth IV induction Rose-- intubation AM CRNA atraumatic-- teeth and mouth as preop ( chipped teeth in front)-- unchanged after laryngoscopy-- bilat BS Rose

## 2017-03-20 NOTE — Op Note (Signed)
03/20/2017  PATIENT:  Leslie Patton  72 y.o. female  Patient Care Team: Gweneth Dimitri, MD as PCP - General (Family Medicine)  PRE-OPERATIVE DIAGNOSIS:    Acute on Chronic Cholecystitis  POST-OPERATIVE DIAGNOSIS:   Acute Gangrenous Cholecystitis with perforation Biliary peritonitis Liver: normal  PROCEDURE:    Laparoscopic cholecystectomy Laparoscopic lysis of adhesions Laparoscopic hepatic repair   SURGEON:  Ardeth Sportsman, MD, FACS.  ASSISTANT: OR Staff   ANESTHESIA:    General with endotracheal intubation Local anesthetic as a field block  EBL:  (See Anesthesia Intraoperative Record) Total I/O In: 2050 [I.V.:1800; IV Piggyback:250] Out: 200 [Blood:200]  Delay start of Pharmacological VTE agent (>24hrs) due to surgical blood loss or risk of bleeding:  no  DRAINS: 19 Fr Blake closed bulb drain in the RUQ   SPECIMEN: Gallbladder    DISPOSITION OF SPECIMEN:  PATHOLOGY  COUNTS:  YES  PLAN OF CARE: Admit to inpatient   PATIENT DISPOSITION:  PACU - hemodynamically stable.  INDICATION: Elderly woman with some dementia and obsessive-compulsive disorder.  Worsening abdominal pain over the past week.  Became unbearable.  Came to emergency room with [facility] sign.  Leukocytosis.  CT scan concerning for cholecystitis.  Pericholecystic fluid versus abscess.  Recommendation made for admission, IV antibiotics, and urgent cholecystectomy  The anatomy & physiology of hepatobiliary & pancreatic function was discussed.  The pathophysiology of gallbladder dysfunction was discussed.  Natural history risks without surgery was discussed.   I feel the risks of no intervention will lead to serious problems that outweigh the operative risks; therefore, I recommended cholecystectomy to remove the pathology.  I explained laparoscopic techniques with possible need for an open approach.  Probable cholangiogram to evaluate the bilary tract was explained as well.    Risks such as bleeding,  infection, abscess, leak, injury to other organs, need for further treatment, heart attack, death, and other risks were discussed.  I noted a good likelihood this will help address the problem.  Possibility that this will not correct all abdominal symptoms was explained.  Goals of post-operative recovery were discussed as well.  We will work to minimize complications.  An educational handout further explaining the pathology and treatment options was given as well.  Questions were answered.  The patient expresses understanding & wishes to proceed with surgery.  OR FINDINGS: Very dilated and pedunculated gallbladder with spontaneous anterior perforation.  Biliary peritonitis right upper quadrant going down to right lower quadrant.  Liver: Posterior gallbladder very intrahepatic with friable but otherwise normal liver.  DESCRIPTION:   The patient was identified & brought in the operating room. The patient was positioned supine with arms tucked. SCDs were active during the entire case. The patient underwent general anesthesia without any difficulty.  The abdomen was prepped and draped in a sterile fashion. A Surgical Timeout confirmed our plan.  I made a transverse curvilinear incision through the superior umbilical fold.  I placed a 5mm long port through the supraumbilical fascia using a modified Hassan cutdown technique with umbilical stalk fascial countertraction. I began carbon dioxide insufflation.  No change in end tidal CO2 measurement.   Camera inspection revealed no injury. There were no adhesions to the anterior abdominal wall supraumbilically.  I proceeded to continue with single site technique. I placed a #5 port in left upper aspect of the wound. I placed a 5 mm atraumatic grasper in the right inferior aspect of the wound.  I turned attention to the right upper quadrant.  Patient in obvious  significant phlegmon with biliary peritonitis in the right upper quadrant.  Place another port in the  right mid abdomen and aborted single site technique.  Confirmed that a transverse colon was not inflamed and away.  The gallbladder was very distended and stretched out with the fundus reaching down into the right lower quadrant.  75% of it was off the liver and pedunculated.  However the other 25% was quite intra-for hepatic with a very stretched out liver.  Usually the lesion was challenged by inflammation and peritonitis as well as a even treating xiphoid that limited view.      Freed the right hepatic ridge off the pedunculated gallbladder.  End up having to transect some very thinned out liver to get around it.  Eventually it was able to grasp the gallbladder fundus and elevate it cephalad.  I freed the peritoneal coverings between the gallbladder and the liver on the posteriolateral and anteriomedial walls.   Can isolate an inflamed lymph node of CLO.  I skeletonized after getting a good critical view transected it.  I found anterior and posterior branches of the cystic artery.  I dissected out the cystic artery; and, after getting a good 360 view, ligated the anterior & posterior branches of the cystic artery close on the infundibulum using the Harmonic ultrasonic dissection.  Did place clips more in the anterior more dominant branch.  However to ensure hemostasis on the liver bed.  There is one deep persistent area with poor liver tissue that continue to use.  Control that area with some Surgicel and pressure.  Irrigated several liters of saline to clean things up.  With circumferential gallbladder dissection and mobilization, I had done a dome down technique to come around the gallbladder and infundibulum and skeletonized the thickened cystic duct.  Given fair visualization and able to get a critical view of the cystic duct without any major elevated liver function tests, I aborted cholangiogram.  I used a 0 PDS Endoloop x2 to lasso the gallbladder and ligate the cystic duct with 2 good ligations.  I  ensured hemostasis on the gallbladder fossa of the liver and elsewhere.     Found a questionable area of exposed superficial bile duct on the right anterior gallbladder fossa.  Not a classic duct of Luschka.  Nonetheless, I did ligate the area down with a 2-0 PDS hepatorrhaphy figure-of-eight laparoscopic intracorporeal sutures to help ligate the exposed liver edge and exposed superficial bile duct down.  We did copious irrigation of several liters of saline.  Hemostasis was good.  There was no evidence of any bile leak.    I inspected the rest of the abdomen & detected no injury nor bleeding elsewhere.  I transected the gallbladder at the infundibulum with harmonic scalpel.  Given the very thickened cystic duct, I also did a suture ligature of the cystic duct with the 2-0 PDS to ensure I had an adequate ligation.  I placed the gallbladder inside an EcoSac bag and did more irrigation to confirm good hemostasis.  I placed a drain as noted above.  I removed the gallbladder out the supraumbilical fascia.  Fascia was poor and had open to 3 cm just to get the very thickened gallbladder out.  I closed the fascia transversely using #1 PDS interrupted stitches. I closed the skin using 4-0 monocryl stitch.  To place a solitary umbilical tape wick inside there since it was nearly gangrenous gallbladder with biliary peritonitis.  Sterile dressing was applied. The patient was  extubated & arrived in the PACU.  She had been mildly tachycardic and dehydrated on start of surgery but by then she was better hydrated and no longer tachycardic or in obvious shock.  She seemed to be in more stable condition..  I had discussed postoperative care with the patient in the holding area. I discussed operative findings, updated the patient's status, discussed probable steps to recovery, and gave postoperative recommendations to the patient's spouse.  Recommendations were made.  Questions were answered.  He expressed understanding &  appreciation.  Ardeth SportsmanSteven C. Marry Kusch, M.D., F.A.C.S. Gastrointestinal and Minimally Invasive Surgery [facility] [address] [place] [id number] [phone] Main / Paging  03/20/2017 12:45 PM

## 2017-03-20 NOTE — Anesthesia Procedure Notes (Signed)
Date/Time: 03/20/2017 12:53 PM Performed by: Minerva EndsMirarchi, Culver Feighner M, CRNA Oxygen Delivery Method: Simple face mask Placement Confirmation: positive ETCO2 and breath sounds checked- equal and bilateral Dental Injury: Teeth and Oropharynx as per pre-operative assessment

## 2017-03-20 NOTE — Anesthesia Preprocedure Evaluation (Addendum)
Anesthesia Evaluation  Patient identified by MRN, date of birth, ID band Patient awake    Reviewed: Allergy & Precautions, NPO status , Patient's Chart, lab work & pertinent test results  Airway Mallampati: II  TM Distance: >3 FB Neck ROM: Full    Dental no notable dental hx. (+) Dental Advisory Given, Chipped   Pulmonary neg pulmonary ROS, former smoker,    Pulmonary exam normal breath sounds clear to auscultation       Cardiovascular hypertension, Normal cardiovascular exam Rhythm:Regular Rate:Normal     Neuro/Psych Anxiety negative neurological ROS     GI/Hepatic negative GI ROS, Neg liver ROS,   Endo/Other  negative endocrine ROS  Renal/GU negative Renal ROS  negative genitourinary   Musculoskeletal negative musculoskeletal ROS (+)   Abdominal   Peds negative pediatric ROS (+)  Hematology negative hematology ROS (+)   Anesthesia Other Findings   Reproductive/Obstetrics negative OB ROS                            Anesthesia Physical Anesthesia Plan  ASA: II  Anesthesia Plan: General   Post-op Pain Management:    Induction: Intravenous  PONV Risk Score and Plan: Ondansetron, Dexamethasone and Treatment may vary due to age or medical condition  Airway Management Planned: Oral ETT  Additional Equipment:   Intra-op Plan:   Post-operative Plan: Extubation in OR  Informed Consent: I have reviewed the patients History and Physical, chart, labs and discussed the procedure including the risks, benefits and alternatives for the proposed anesthesia with the patient or authorized representative who has indicated his/her understanding and acceptance.   Dental advisory given  Plan Discussed with: CRNA and Surgeon  Anesthesia Plan Comments:         Anesthesia Quick Evaluation

## 2017-03-20 NOTE — ED Notes (Signed)
Placed on [place] for low SPO2 after pain med, will continue to monitor, alert, NAD, calm, interactive, [facility], [facility].

## 2017-03-20 NOTE — ED Provider Notes (Signed)
72 year old female [facility] with diagnosis of acute cholecystitis.  Dr. Michaell CowingGross is here evaluating the patient.  She is resting comfortably at this point.   Dione BoozeGlick, Grainne Knights, MD 03/20/17 [phone]

## 2017-03-21 ENCOUNTER — Encounter (HOSPITAL_COMMUNITY): Payer: Self-pay | Admitting: Surgery

## 2017-03-21 LAB — COMPREHENSIVE METABOLIC PANEL
ALBUMIN: 2.9 g/dL — AB (ref 3.5–5.0)
ALK PHOS: 82 U/L (ref 38–126)
ALT: 64 U/L — AB (ref 14–54)
AST: 63 U/L — AB (ref 15–41)
Anion gap: 7 (ref 5–15)
BILIRUBIN TOTAL: 0.3 mg/dL (ref 0.3–1.2)
BUN: 13 mg/dL (ref 6–20)
CALCIUM: 8 mg/dL — AB (ref 8.9–10.3)
CO2: 23 mmol/L (ref 22–32)
CREATININE: 0.67 mg/dL (ref 0.44–1.00)
Chloride: 108 mmol/L (ref 101–111)
GFR calc Af Amer: >60 mL/min (ref >60)
GFR calc non Af Amer: >60 mL/min (ref >60)
GLUCOSE: 208 mg/dL — AB (ref 65–99)
POTASSIUM: 3.5 mmol/L (ref 3.5–5.1)
Sodium: 138 mmol/L (ref 135–145)
TOTAL PROTEIN: 6.2 g/dL — AB (ref 6.5–8.1)

## 2017-03-21 LAB — CBC WITH DIFFERENTIAL/PLATELET
BASOS ABS: 0 10*3/uL (ref 0.0–0.1)
Basophils Relative: 0 %
EOS ABS: 0 10*3/uL (ref 0.0–0.7)
EOS PCT: 0 %
HCT: 31.4 % — ABNORMAL LOW (ref 36.0–46.0)
Hemoglobin: 9.8 g/dL — ABNORMAL LOW (ref 12.0–15.0)
LYMPHS PCT: 3 %
Lymphs Abs: 0.5 10*3/uL — ABNORMAL LOW (ref 0.7–4.0)
MCH: 27.1 pg (ref 26.0–34.0)
MCHC: 31.2 g/dL (ref 30.0–36.0)
MCV: 86.7 fL (ref 78.0–100.0)
MONO ABS: 0.9 10*3/uL (ref 0.1–1.0)
Monocytes Relative: 6 %
Neutro Abs: 14 10*3/uL — ABNORMAL HIGH (ref 1.7–7.7)
Neutrophils Relative %: 91 %
PLATELETS: 190 10*3/uL (ref 150–400)
RBC: 3.62 MIL/uL — ABNORMAL LOW (ref 3.87–5.11)
RDW: 16 % — AB (ref 11.5–15.5)
WBC: 15.4 10*3/uL — ABNORMAL HIGH (ref 4.0–10.5)

## 2017-03-21 LAB — GLUCOSE, CAPILLARY
Glucose-Capillary: 145 mg/dL — ABNORMAL HIGH (ref 65–99)
Glucose-Capillary: 187 mg/dL — ABNORMAL HIGH (ref 65–99)

## 2017-03-21 MED ORDER — ACETAMINOPHEN 500 MG PO TABS
1000.0000 mg | ORAL_TABLET | Freq: Three times a day (TID) | ORAL | Status: DC
  Administered 2017-03-21 – 2017-03-29 (×24): 1000 mg via ORAL
  Filled 2017-03-21 (×25): qty 2

## 2017-03-21 MED ORDER — SODIUM CHLORIDE 0.9% FLUSH: 3.0000 mL | INTRAVENOUS | Status: DC | PRN

## 2017-03-21 MED ORDER — SODIUM CHLORIDE 0.9 % IV SOLN: 250.0000 mL | INTRAVENOUS | Status: DC | PRN

## 2017-03-21 MED ORDER — SODIUM CHLORIDE 0.9% FLUSH
3.0000 mL | Freq: Two times a day (BID) | INTRAVENOUS | Status: DC
  Administered 2017-03-21 – 2017-03-29 (×11): 3 mL via INTRAVENOUS

## 2017-03-21 NOTE — Progress Notes (Signed)
[facility]  [address] [place], [place] [address] Phone: [phone]  FAX: [phone]      Tanisia Yokley [id number] 18-Jun-1945  CARE TEAM:  PCP: Cari Caraway, MD  Outpatient Care Team: Patient Care Team: Cari Caraway, MD as PCP - General (Family Medicine)  Inpatient Treatment Team: Treatment Team: Attending Provider: Michael Boston, MD; Consulting Physician: Edison Pace, Md, MD; Registered Nurse: Irven Baltimore, RN   Problem List:   Principal Problem:   Perforated gallbladder s/p laparoscopic cholecystectomy 03/20/2017 Active Problems:   OSA (obstructive sleep apnea)   Gait difficulty   Risk for falls   Acute on chronic cholecystitis s/p laparoscopic cholecystectomy 03/20/2017   Anxiety   OCD (obsessive compulsive disorder)   Hypertension   Hypokalemia   Peritonitis due to bile from perforated gallbladder   Status post laparoscopic cholecystectomy   1 Day Post-Op  03/20/2017  POST-OPERATIVE DIAGNOSIS:   Acute Gangrenous Cholecystitis with perforation Biliary peritonitis Liver: normal  PROCEDURE:    Laparoscopic cholecystectomy Laparoscopic lysis of adhesions Laparoscopic hepatic repair   SURGEON:  Adin Hector, MD, FACS.     Assessment  Recovering  Plan:  -adv Dys1 diet  -follow off IVF - PRN boluses  -check labs  -drain serobilious but tapering output.  Hopefully just left over from gallbladder rupture and and bilious peritonitis but follow.  If increased output or output more bilious, GI consult for probable ERCP and stenting for superficial liver bile leak:  superficial exposed bile duct branch on the edge of the right lobe of the liver right upper outer quadrant of gallbladder fossa that was sutured intraoperatively hopefully will remain closed but at risk.  Drain needs to stay 2 weeks.  I will pull out in the office.  IV antibiotics for 1 week minimum given bilious peritonitis.   Cipro/Flagyl given peritonitis.  Follow white count.  Continue medications for ?dementia.  Aricept.  Continue mentions medications for obsessive-compulsive disorder.  Anxiolysis.  Held AM Xanax given rather sleepy with anesthesia.  More alert now.  Hypertension control.  Follow off usual medicines.  As needed backup.  History of sleep apnea nocturnal hypoxia.  Oxygen at night.  -VTE prophylaxis- SCDs, etc  -mobilize as tolerated to help recovery.  Physical and Occupational Therapy consults.  May benefit from skilled nursing facility with rehab if does not improve.  Follow clinically.  30 minutes spent in review, evaluation, examination, counseling, and coordination of care.  More than 50% of that time was spent in counseling.  I updated the patient's status to the patient and nurse.  Recommendations were made.  Questions were answered.  They expressed understanding & appreciation.   Adin Hector, M.D., F.A.C.S. Gastrointestinal and Minimally Invasive Surgery [facility] [address] [place], [place] [address] [phone] Main / Paging   03/21/2017    Subjective: (Chief complaint)  Tolerating liquids.    Rather sore but much improved compared to before surgery.  Medicines helping.  No hemodynamic events.  ICU nurse feels like she is much improved.  Rather sleepy through the night but more alert this morning.  Objective:  Vital signs:  Vitals:   03/21/17 0304 03/21/17 0400 03/21/17 0500 03/21/17 0600  BP:  (!) 141/82 (!) 160/91 (!) 152/93  Pulse:  82 95 86  Resp:  10 20 12   Temp: 98 F (36.7 C)     TempSrc: Oral     SpO2:  96% 93% 96%  Weight:  Height:           Intake/Output   Yesterday:  01/19 0701 - 01/20 0700 In: 0712 [I.V.:3000; IV Piggyback:850] Out: 1025 [Urine:600; Drains:225; Blood:200] This shift:  No intake/output data recorded.  Bowel function:  Flatus: No  BM:  No  Drain: Sanginobilious.   Mostly bloody.   Physical Exam:  General: Pt awake/alert/oriented x4 in no acute distress Eyes: PERRL, normal EOM.  Sclera clear.  No icterus Neuro: CN II-XII intact w/o focal sensory/motor deficits. Lymph: No head/neck/groin lymphadenopathy Psych:  No delerium/psychosis/paranoia HENT: Normocephalic, Mucus membranes moist.  No thrush Neck: Supple, No tracheal deviation Chest: No chest wall pain w good excursion CV:  Pulses intact.  Regular rhythm MS: Normal AROM mjr joints.  No obvious deformity  Abdomen: Somewhat firm.  Mildy distended.  Mildly tender at incisions only.  No evidence of peritonitis.  No incarcerated hernias.  Ext:  No deformity.  No mjr edema.  No cyanosis Skin: No petechiae / purpura  Results:   Labs: Results for orders placed or performed during the hospital encounter of 03/19/17 (from the past 48 hour(s))  Urinalysis, Routine w reflex microscopic     Status: Abnormal   Collection Time: 03/19/17 11:00 PM  Result Value Ref Range   Color, Urine YELLOW YELLOW   APPearance CLEAR CLEAR   Specific Gravity, Urine 1.010 1.005 - 1.030   pH 6.0 5.0 - 8.0   Glucose, UA NEGATIVE NEGATIVE mg/dL   Hgb urine dipstick TRACE (A) NEGATIVE   Bilirubin Urine NEGATIVE NEGATIVE   Ketones, ur NEGATIVE NEGATIVE mg/dL   Protein, ur NEGATIVE NEGATIVE mg/dL   Nitrite POSITIVE (A) NEGATIVE   Leukocytes, UA TRACE (A) NEGATIVE  Urinalysis, Microscopic (reflex)     Status: Abnormal   Collection Time: 03/19/17 11:00 PM  Result Value Ref Range   RBC / HPF 0-5 0 - 5 RBC/hpf   WBC, UA 0-5 0 - 5 WBC/hpf   Bacteria, UA MANY (A) NONE SEEN   Squamous Epithelial / LPF 0-5 (A) NONE SEEN  CBC with Differential     Status: Abnormal   Collection Time: 03/19/17 11:30 PM  Result Value Ref Range   WBC 14.5 (H) 4.0 - 10.5 K/uL   RBC 4.40 3.87 - 5.11 MIL/uL   Hemoglobin 11.9 (L) 12.0 - 15.0 g/dL   HCT 37.8 36.0 - 46.0 %   MCV 85.9 78.0 - 100.0 fL   MCH 27.0 26.0 - 34.0 pg   MCHC 31.5 30.0 -  36.0 g/dL   RDW 15.8 (H) 11.5 - 15.5 %   Platelets 198 150 - 400 K/uL   Neutrophils Relative % 87 %   Neutro Abs 12.5 (H) 1.7 - 7.7 K/uL   Lymphocytes Relative 8 %   Lymphs Abs 1.2 0.7 - 4.0 K/uL   Monocytes Relative 5 %   Monocytes Absolute 0.8 0.1 - 1.0 K/uL   Eosinophils Relative 0 %   Eosinophils Absolute 0.0 0.0 - 0.7 K/uL   Basophils Relative 0 %   Basophils Absolute 0.0 0.0 - 0.1 K/uL  Comprehensive metabolic panel     Status: Abnormal   Collection Time: 03/19/17 11:30 PM  Result Value Ref Range   Sodium 135 135 - 145 mmol/L   Potassium 3.2 (L) 3.5 - 5.1 mmol/L   Chloride 99 (L) 101 - 111 mmol/L   CO2 25 22 - 32 mmol/L   Glucose, Bld 169 (H) 65 - 99 mg/dL   BUN 17 6 - 20 mg/dL  Creatinine, Ser 0.82 0.44 - 1.00 mg/dL   Calcium 8.6 (L) 8.9 - 10.3 mg/dL   Total Protein 7.1 6.5 - 8.1 g/dL   Albumin 3.7 3.5 - 5.0 g/dL   AST 46 (H) 15 - 41 U/L   ALT 40 14 - 54 U/L   Alkaline Phosphatase 109 38 - 126 U/L   Total Bilirubin 0.8 0.3 - 1.2 mg/dL   GFR calc non Af Amer >60 >60 mL/min   GFR calc Af Amer >60 >60 mL/min    Comment: (NOTE) The eGFR has been calculated using the CKD EPI equation. This calculation has not been validated in all clinical situations. eGFR's persistently <60 mL/min signify possible Chronic Kidney Disease.    Anion gap 11 5 - 15  Lipase, blood     Status: None   Collection Time: 03/19/17 11:30 PM  Result Value Ref Range   Lipase 47 11 - 51 U/L  Hemoglobin A1c     Status: None   Collection Time: 03/20/17  3:31 AM  Result Value Ref Range   Hgb A1c MFr Bld 5.1 4.8 - 5.6 %    Comment: (NOTE) Pre diabetes:          5.7%-6.4% Diabetes:              >6.4% Glycemic control for   <7.0% adults with diabetes    Mean Plasma Glucose 99.67 mg/dL    Comment: Performed at [facility] [address] [place], [place] [address]  Magnesium     Status: Abnormal   Collection Time: 03/20/17  7:00 AM  Result Value Ref Range   Magnesium 1.6 (L) 1.7 - 2.4  mg/dL  CBG monitoring, ED     Status: Abnormal   Collection Time: 03/20/17  8:37 AM  Result Value Ref Range   Glucose-Capillary 127 (H) 65 - 99 mg/dL  Glucose, capillary     Status: Abnormal   Collection Time: 03/20/17  1:31 PM  Result Value Ref Range   Glucose-Capillary 130 (H) 65 - 99 mg/dL  Glucose, capillary     Status: Abnormal   Collection Time: 03/20/17  5:18 PM  Result Value Ref Range   Glucose-Capillary 151 (H) 65 - 99 mg/dL  Glucose, capillary     Status: Abnormal   Collection Time: 03/20/17  9:02 PM  Result Value Ref Range   Glucose-Capillary 147 (H) 65 - 99 mg/dL   Comment 1 Notify RN    Comment 2 Document in Chart     Imaging / Studies: Ct Abdomen Pelvis W Contrast  Result Date: 03/20/2017 CLINICAL DATA:  72 year old female with right upper quadrant abdominal pain. EXAM: CT ABDOMEN AND PELVIS WITH CONTRAST TECHNIQUE: Multidetector CT imaging of the abdomen and pelvis was performed using the standard protocol following bolus administration of intravenous contrast. CONTRAST:  188m ISOVUE-300 IOPAMIDOL (ISOVUE-300) INJECTION 61% COMPARISON:  None. FINDINGS: Lower chest: There are bibasilar subsegmental atelectatic changes. No intra-free air. There is trace free fluid within the pelvis as well as small perihepatic ascites. Hepatobiliary: The gallbladder is distended. There is mild inflammatory changes of the gallbladder wall and pericholecystic fluid. No calcified gallstone identified. There is a focal area of irregularity of the gallbladder wall with apparent discontinuity in the gallbladder mucosa (series 2, image 37) involving the body of the gallbladder. A 1.6 x 0.5 cm fluid in the periphery of the gallbladder may represent small pericholecystic fluid versus wall edema or focally contained collection secondary to gallbladder perforation. Further evaluation with  ultrasound recommended. There is mild dilatation of the common bile duct and intrahepatic biliary tree. Subcentimeter  right hepatic hypodense lesion is too small to characterize. The liver is otherwise unremarkable. Pancreas: Unremarkable. No pancreatic ductal dilatation or surrounding inflammatory changes. Spleen: Normal in size without focal abnormality. Adrenals/Urinary Tract: The adrenal glands are unremarkable. There is a 1 cm right renal cyst. There is no hydronephrosis on either side. There is symmetric enhancement and excretion of contrast by both kidneys. Small bilateral renal parapelvic cysts noted. The visualized ureters and urinary bladder appear unremarkable. Stomach/Bowel: There is a moderate size hiatal hernia containing a portion of the stomach with organo-axial volvulus. No evidence of gastric outlet obstruction. There is sigmoid diverticulosis without active inflammatory changes. No bowel obstruction. The appendix is not visualized with certainty. No inflammatory changes identified in the right lower quadrant. Vascular/Lymphatic: No significant vascular findings are present. No enlarged abdominal or pelvic lymph nodes. Reproductive: The uterus and ovaries are grossly unremarkable. No pelvic mass. Other: None Musculoskeletal: Mild degenerative changes of the spine. No acute osseous pathology. IMPRESSION: 1. Inflammatory changes of the gallbladder most consistent with acute cholecystitis. No calcified gallstone identified. Further evaluation with ultrasound recommended. An area of apparent irregularity of the gallbladder wall with mild small amount of adjacent fluid along the body of the gallbladder may represent edema within the gallbladder. However, focal gallbladder perforation is not entirely excluded. 2. Mild dilatation of the intrahepatic biliary tree and CBD. No calcified stone identified in the central CBD. 3. Moderate hiatal hernia containing portion of the stomach. No bowel obstruction. 4. Scattered sigmoid diverticula. Electronically Signed   By: Anner Crete M.D.   On: 03/20/2017 01:01     Medications / Allergies: per chart  Antibiotics: Anti-infectives (From admission, onward)   Start     Dose/Rate Route Frequency Ordered Stop   03/20/17 1400  metroNIDAZOLE (FLAGYL) IVPB 500 mg     500 mg 100 mL/hr over 60 Minutes Intravenous Every 8 hours 03/20/17 0331     03/20/17 0600  clindamycin (CLEOCIN) IVPB 900 mg     900 mg 100 mL/hr over 30 Minutes Intravenous On call to O.R. 03/20/17 0312 03/20/17 1100   03/20/17 0600  gentamicin (GARAMYCIN) 390 mg in dextrose 5 % 100 mL IVPB     5 mg/kg  77.1 kg 109.8 mL/hr over 60 Minutes Intravenous On call to O.R. 03/20/17 0312 03/20/17 1027   03/20/17 0400  ciprofloxacin (CIPRO) IVPB 400 mg     400 mg 200 mL/hr over 60 Minutes Intravenous 2 times daily 03/20/17 0331     03/20/17 0115  cefTRIAXone (ROCEPHIN) 1 g in dextrose 5 % 50 mL IVPB     1 g 100 mL/hr over 30 Minutes Intravenous  Once 03/20/17 0110 03/20/17 0441   03/20/17 0115  metroNIDAZOLE (FLAGYL) IVPB 500 mg     500 mg 100 mL/hr over 60 Minutes Intravenous  Once 03/20/17 0110          Note: Portions of this report may have been transcribed using voice recognition software. Every effort was made to ensure accuracy; however, inadvertent computerized transcription errors may be present.   Any transcriptional errors that result from this process are unintentional.     Adin Hector, M.D., F.A.C.S. Gastrointestinal and Minimally Invasive Surgery [facility] [address] [place], [place] [address] [phone] Main / Paging   03/21/2017

## 2017-03-22 LAB — GLUCOSE, CAPILLARY
GLUCOSE-CAPILLARY: 103 mg/dL — AB (ref 65–99)
GLUCOSE-CAPILLARY: 123 mg/dL — AB (ref 65–99)
GLUCOSE-CAPILLARY: 136 mg/dL — AB (ref 65–99)
GLUCOSE-CAPILLARY: 150 mg/dL — AB (ref 65–99)
Glucose-Capillary: 107 mg/dL — ABNORMAL HIGH (ref 65–99)
Glucose-Capillary: 126 mg/dL — ABNORMAL HIGH (ref 65–99)

## 2017-03-22 LAB — URINE CULTURE: Culture: >=100000 — AB

## 2017-03-22 MED ORDER — SIMETHICONE 80 MG PO CHEW: 40.0000 mg | CHEWABLE_TABLET | Freq: Four times a day (QID) | ORAL | Status: DC | PRN

## 2017-03-22 NOTE — Evaluation (Signed)
Occupational Therapy Evaluation Patient Details Name: Leslie Peelingatricia Godsil MRN: [id number] DOB: 10/26/1945 Today's Date: 03/22/2017    History of Present Illness Patient is a 72 y/o female admitted with acute cholecystitis now s/p lap chole 03/20/17.  PMH positive for OSA, OCD, anxiety, HTN and gait difficutly.    Clinical Impression   This 72 year old female was admitted for the above. She was independent with adls prior to admission and currently needs min guard assist. Will follow in acute setting with mod I level goals.  Husband can assist as needed at home     Follow Up Recommendations  No OT follow up    Equipment Recommendations  None recommended by OT    Recommendations for Other Services       Precautions / Restrictions Precautions Precautions: Fall Restrictions Weight Bearing Restrictions: No      Mobility Bed Mobility Overal bed mobility: Needs Assistance Bed Mobility: Rolling;Sidelying to Sit Rolling: Supervision Sidelying to sit: Supervision       General bed mobility comments: oob  Transfers Overall transfer level: Needs assistance Equipment used: Rolling walker (2 wheeled) Transfers: Sit to/from Stand Sit to Stand: Min guard         General transfer comment: cues for UE placement    Balance Overall balance assessment: Needs assistance Sitting-balance support: Feet supported;No upper extremity supported Sitting balance-Leahy Scale: Good     Standing balance support: No upper extremity supported;During functional activity Standing balance-Leahy Scale: Fair Standing balance comment: standing at sink to wash hands and brush teeth                           ADL either performed or assessed with clinical judgement   ADL Overall ADL's : Needs assistance/impaired Eating/Feeding: Modified independent(modified diet)   Grooming: Wash/dry hands;Supervision/safety;Standing   Upper Body Bathing: Set up;Sitting   Lower Body Bathing: Min  guard;Sit to/from stand   Upper Body Dressing : Set up;Sitting   Lower Body Dressing: Min guard;Sit to/from stand   Toilet Transfer: Min guard;Ambulation;Comfort height toilet;RW   Toileting- Clothing Manipulation and Hygiene: Minimal assistance;Sit to/from stand         General ADL Comments: ambulated to bathroom and ambulated in hall afterwards.  Educated on working with in comfort. She is able to cross legs for adls.  She sat on comfort height commode:  one of her toilets is higher than this and she will use this one.       Vision         Perception     Praxis      Pertinent Vitals/Pain Pain Assessment: No/denies pain     Hand Dominance     Extremity/Trunk Assessment Upper Extremity Assessment Upper Extremity Assessment: Overall WFL for tasks assessed          Communication Communication Communication: No difficulties   Cognition Arousal/Alertness: Awake/alert Behavior During Therapy: WFL for tasks assessed/performed Overall Cognitive Status: Within Functional Limits for tasks assessed                                     General Comments       Exercises     Shoulder Instructions      Home Living Family/patient expects to be discharged to:: Private residence Living Arrangements: Spouse/significant other Available Help at Discharge: Family;Available 24 hours/day  Bathroom Shower/Tub: Psychologist, counselling;Tub/shower unit   Bathroom Toilet: Standard     Home Equipment: None          Prior Functioning/Environment Level of Independence: Independent                 OT Problem List: Decreased strength;Decreased activity tolerance;Impaired balance (sitting and/or standing);Decreased knowledge of use of DME or AE;Pain      OT Treatment/Interventions: Self-care/ADL training;DME and/or AE instruction;Patient/family education;Balance training    OT Goals(Current goals can be found in the care plan section) Acute  Rehab OT Goals Patient Stated Goal: To return home OT Goal Formulation: With patient Time For Goal Achievement: 04/05/17 Potential to Achieve Goals: Good ADL Goals Pt Will Transfer to Toilet: with modified independence;ambulating Pt Will Perform Toileting - Clothing Manipulation and hygiene: with modified independence;sit to/from stand Pt Will Perform Tub/Shower Transfer: Shower transfer;with modified independence;ambulating  OT Frequency: Min 2X/week   Barriers to D/C:            Co-evaluation              AM-PAC PT "6 Clicks" Daily Activity     Outcome Measure Help from another person eating meals?: None Help from another person taking care of personal grooming?: A Little Help from another person toileting, which includes using toliet, bedpan, or urinal?: A Little Help from another person bathing (including washing, rinsing, drying)?: A Little Help from another person to put on and taking off regular upper body clothing?: A Little Help from another person to put on and taking off regular lower body clothing?: A Little 6 Click Score: 19   End of Session    Activity Tolerance: Patient tolerated treatment well Patient left: in chair;with call bell/phone within reach;with family/visitor present  OT Visit Diagnosis: Muscle weakness (generalized) (M62.81)                Time: 4098-1191 OT Time Calculation (min): 20 min Charges:  OT General Charges $OT Visit: 1 Visit OT Evaluation $OT Eval Low Complexity: 1 Low G-Codes:     [place], OTR/L [phone] 03/22/2017  Leslie Patton 03/22/2017, 2:09 PM

## 2017-03-22 NOTE — Progress Notes (Signed)
Patient ID: Leslie Patton, female   DOB: 21-Nov-1945, 72 y.o.   MRN: [id number] [facility] Progress Note:   2 Days Post-Op  Subjective: Mental status is appropriate;  Sitting up in chair Objective: Vital signs in last 24 hours: Temp:  [97.8 F (36.6 C)-99.3 F (37.4 C)] 98.6 F (37 C) (01/21 1400) Pulse Rate:  [92-104] 104 (01/21 1400) Resp:  [18-20] 18 (01/21 1400) BP: (110-142)/(68-92) 110/78 (01/21 1400) SpO2:  [93 %-99 %] 93 % (01/21 1400)  Intake/Output from previous day: 01/20 0701 - 01/21 0700 In: 1090 [P.O.:390; IV Piggyback:700] Out: 1300 [Urine:900; Drains:400] Intake/Output this shift: Total I/O In: 480 [P.O.:480] Out: 360 [Urine:300; Drains:60]  Physical Exam: Work of breathing is not labored.  JP with much dark green drainage  Lab Results:  Results for orders placed or performed during the hospital encounter of 03/19/17 (from the past 48 hour(s))  Glucose, capillary     Status: Abnormal   Collection Time: 03/20/17  5:18 PM  Result Value Ref Range   Glucose-Capillary 151 (H) 65 - 99 mg/dL  Glucose, capillary     Status: Abnormal   Collection Time: 03/20/17  9:02 PM  Result Value Ref Range   Glucose-Capillary 147 (H) 65 - 99 mg/dL   Comment 1 Notify RN    Comment 2 Document in Chart   Glucose, capillary     Status: Abnormal   Collection Time: 03/21/17  8:01 AM  Result Value Ref Range   Glucose-Capillary 123 (H) 65 - 99 mg/dL  Comprehensive metabolic panel     Status: Abnormal   Collection Time: 03/21/17  9:39 AM  Result Value Ref Range   Sodium 138 135 - 145 mmol/L   Potassium 3.5 3.5 - 5.1 mmol/L   Chloride 108 101 - 111 mmol/L   CO2 23 22 - 32 mmol/L   Glucose, Bld 208 (H) 65 - 99 mg/dL   BUN 13 6 - 20 mg/dL   Creatinine, Ser 0.67 0.44 - 1.00 mg/dL   Calcium 8.0 (L) 8.9 - 10.3 mg/dL   Total Protein 6.2 (L) 6.5 - 8.1 g/dL   Albumin 2.9 (L) 3.5 - 5.0 g/dL   AST 63 (H) 15 - 41 U/L   ALT 64 (H) 14 - 54 U/L   Alkaline Phosphatase 82 38 -  126 U/L   Total Bilirubin 0.3 0.3 - 1.2 mg/dL   GFR calc non Af Amer >60 >60 mL/min   GFR calc Af Amer >60 >60 mL/min    Comment: (NOTE) The eGFR has been calculated using the CKD EPI equation. This calculation has not been validated in all clinical situations. eGFR's persistently <60 mL/min signify possible Chronic Kidney Disease.    Anion gap 7 5 - 15  CBC with Differential/Platelet     Status: Abnormal   Collection Time: 03/21/17  9:39 AM  Result Value Ref Range   WBC 15.4 (H) 4.0 - 10.5 K/uL   RBC 3.62 (L) 3.87 - 5.11 MIL/uL   Hemoglobin 9.8 (L) 12.0 - 15.0 g/dL   HCT 31.4 (L) 36.0 - 46.0 %   MCV 86.7 78.0 - 100.0 fL   MCH 27.1 26.0 - 34.0 pg   MCHC 31.2 30.0 - 36.0 g/dL   RDW 16.0 (H) 11.5 - 15.5 %   Platelets 190 150 - 400 K/uL   Neutrophils Relative % 91 %   Neutro Abs 14.0 (H) 1.7 - 7.7 K/uL   Lymphocytes Relative 3 %   Lymphs Abs 0.5 (L) 0.7 -  4.0 K/uL   Monocytes Relative 6 %   Monocytes Absolute 0.9 0.1 - 1.0 K/uL   Eosinophils Relative 0 %   Eosinophils Absolute 0.0 0.0 - 0.7 K/uL   Basophils Relative 0 %   Basophils Absolute 0.0 0.0 - 0.1 K/uL  Glucose, capillary     Status: Abnormal   Collection Time: 03/21/17 12:13 PM  Result Value Ref Range   Glucose-Capillary 145 (H) 65 - 99 mg/dL   Comment 1 Notify RN    Comment 2 Document in Chart   Glucose, capillary     Status: Abnormal   Collection Time: 03/21/17  4:53 PM  Result Value Ref Range   Glucose-Capillary 187 (H) 65 - 99 mg/dL  Glucose, capillary     Status: Abnormal   Collection Time: 03/22/17 12:09 AM  Result Value Ref Range   Glucose-Capillary 136 (H) 65 - 99 mg/dL  Glucose, capillary     Status: Abnormal   Collection Time: 03/22/17  7:49 AM  Result Value Ref Range   Glucose-Capillary 103 (H) 65 - 99 mg/dL  Glucose, capillary     Status: Abnormal   Collection Time: 03/22/17 11:57 AM  Result Value Ref Range   Glucose-Capillary 150 (H) 65 - 99 mg/dL    Radiology/Results: No results  found.  Anti-infectives: Anti-infectives (From admission, onward)   Start     Dose/Rate Route Frequency Ordered Stop   03/20/17 1400  metroNIDAZOLE (FLAGYL) IVPB 500 mg     500 mg 100 mL/hr over 60 Minutes Intravenous Every 8 hours 03/20/17 0331 03/27/17 1359   03/20/17 0600  clindamycin (CLEOCIN) IVPB 900 mg     900 mg 100 mL/hr over 30 Minutes Intravenous On call to O.R. 03/20/17 0312 03/20/17 1100   03/20/17 0600  gentamicin (GARAMYCIN) 390 mg in dextrose 5 % 100 mL IVPB     5 mg/kg  77.1 kg 109.8 mL/hr over 60 Minutes Intravenous On call to O.R. 03/20/17 0312 03/20/17 1027   03/20/17 0400  ciprofloxacin (CIPRO) IVPB 400 mg     400 mg 200 mL/hr over 60 Minutes Intravenous 2 times daily 03/20/17 0331 03/27/17 0959   03/20/17 0115  cefTRIAXone (ROCEPHIN) 1 g in dextrose 5 % 50 mL IVPB     1 g 100 mL/hr over 30 Minutes Intravenous  Once 03/20/17 0110 03/20/17 0441   03/20/17 0115  metroNIDAZOLE (FLAGYL) IVPB 500 mg  Status:  Discontinued     500 mg 100 mL/hr over 60 Minutes Intravenous  Once 03/20/17 0110 03/21/17 0809      Assessment/Plan: Problem List: Patient Active Problem List   Diagnosis Date Noted  . Acute on chronic cholecystitis s/p laparoscopic cholecystectomy 03/20/2017 03/20/2017  . Anxiety 03/20/2017  . Hypokalemia 03/20/2017  . Perforated gallbladder s/p laparoscopic cholecystectomy 03/20/2017 03/20/2017  . Peritonitis due to bile from perforated gallbladder 03/20/2017  . Status post laparoscopic cholecystectomy 03/20/2017  . OCD (obsessive compulsive disorder)   . Hypertension   . Risk for falls 09/11/2015  . Nocturnal hypoxia 05/30/2014  . OSA (obstructive sleep apnea) 03/21/2014  . Gait difficulty 03/21/2014  . Peripheral polyneuropathy 03/21/2014  . Cognitive changes 03/21/2014    Not ready for discharge.  Significant bilious drainagel 2 Days Post-Op    LOS: 2 days   Matt B. Hassell Done, MD, [facility] [phone]  beeper [phone]  03/22/2017 2:28 PM

## 2017-03-22 NOTE — Evaluation (Signed)
Physical Therapy Evaluation Patient Details Name: Leslie Patton MRN: [id number] DOB: 1945/05/23 Today's Date: 03/22/2017   History of Present Illness  Patient is a 72 y/o female admitted with acute cholecystitis now s/p lap chole 03/20/17.  PMH positive for OSA, OCD, anxiety, HTN and gait difficutly.   Clinical Impression  Patient presents with decreased independence with mobility due to deficits listed in PT problem list.  Patient currently min A to S level for mobility with RW.  Previously was independent.  Spouse can assist upon d/c.  Recommend HHPT follow up.  PT to follow acutely as well.     Follow Up Recommendations Home health PT;Supervision/Assistance - 24 hour    Equipment Recommendations  Rolling walker with 5" wheels;3in1 (PT)    Recommendations for Other Services       Precautions / Restrictions Precautions Precautions: Fall Restrictions Weight Bearing Restrictions: No      Mobility  Bed Mobility Overal bed mobility: Needs Assistance Bed Mobility: Rolling;Sidelying to Sit Rolling: Supervision Sidelying to sit: Supervision       General bed mobility comments: cues for technique and use of railing  Transfers Overall transfer level: Needs assistance Equipment used: Rolling walker (2 wheeled) Transfers: Sit to/from Stand Sit to Stand: Min guard;Min assist         General transfer comment: cues for hand placement, assist due to initially posterior when pulling up with both hands on walker  Ambulation/Gait Ambulation/Gait assistance: Min guard;Supervision Ambulation Distance (Feet): 120 Feet Assistive device: Rolling walker (2 wheeled) Gait Pattern/deviations: Step-through pattern;Decreased stride length;Trunk flexed     General Gait Details: steady gait with slow pace, increased time for turns  Careers information officer    Modified Rankin (Stroke Patients Only)       Balance Overall balance assessment: Needs  assistance Sitting-balance support: Feet supported;No upper extremity supported Sitting balance-Leahy Scale: Good     Standing balance support: No upper extremity supported;During functional activity Standing balance-Leahy Scale: Fair Standing balance comment: standing at sink to wash hands and brush teeth                             Pertinent Vitals/Pain Pain Assessment: 0-10 Pain Score: 0-No pain(controlled at rest)    Home Living Family/patient expects to be discharged to:: Private residence Living Arrangements: Spouse/significant other Available Help at Discharge: Family;Available 24 hours/day Type of Home: House Home Access: Level entry     Home Layout: One level Home Equipment: None      Prior Function Level of Independence: Independent               Hand Dominance        Extremity/Trunk Assessment   Upper Extremity Assessment Upper Extremity Assessment: Overall WFL for tasks assessed    Lower Extremity Assessment Lower Extremity Assessment: Generalized weakness       Communication   Communication: No difficulties  Cognition Arousal/Alertness: Awake/alert Behavior During Therapy: WFL for tasks assessed/performed Overall Cognitive Status: Within Functional Limits for tasks assessed                                        General Comments      Exercises     Assessment/Plan    PT Assessment Patient needs continued PT services  PT Problem List  Decreased balance;Decreased knowledge of use of DME;Decreased strength;Decreased mobility;Decreased safety awareness       PT Treatment Interventions DME instruction;Functional mobility training;Balance training;Patient/family education;Gait training;Therapeutic activities    PT Goals (Current goals can be found in the Care Plan section)  Acute Rehab PT Goals Patient Stated Goal: To return home PT Goal Formulation: With patient Time For Goal Achievement:  03/29/17 Potential to Achieve Goals: Good    Frequency Min 3X/week   Barriers to discharge        Co-evaluation               AM-PAC PT "6 Clicks" Daily Activity  Outcome Measure Difficulty turning over in bed (including adjusting bedclothes, sheets and blankets)?: A Little Difficulty moving from lying on back to sitting on the side of the bed? : A Little Difficulty sitting down on and standing up from a chair with arms (e.g., wheelchair, bedside commode, etc,.)?: Unable Help needed moving to and from a bed to chair (including a wheelchair)?: A Little Help needed walking in hospital room?: A Little Help needed climbing 3-5 steps with a railing? : A Lot 6 Click Score: 15    End of Session Equipment Utilized During Treatment: Gait belt Activity Tolerance: Patient tolerated treatment well Patient left: in chair;with call bell/phone within reach   PT Visit Diagnosis: Other abnormalities of gait and mobility (R26.89)    Time: [id number] PT Time Calculation (min) (ACUTE ONLY): 37 min   Charges:   PT Evaluation $PT Eval Moderate Complexity: 1 Mod PT Treatments $Gait Training: 8-22 mins   PT G CodesSheran Lawless:        Cyndi Daris Aristizabal, [place] CarolinaPT [phone] 03/22/2017   Elray Mcgregorynthia Alister Staver 03/22/2017, 10:19 AM

## 2017-03-23 ENCOUNTER — Inpatient Hospital Stay (HOSPITAL_COMMUNITY): Payer: Medicare Other

## 2017-03-23 ENCOUNTER — Encounter (HOSPITAL_COMMUNITY): Payer: Self-pay | Admitting: Student

## 2017-03-23 LAB — CBC
HCT: 31.1 % — ABNORMAL LOW (ref 36.0–46.0)
HEMOGLOBIN: 9.7 g/dL — AB (ref 12.0–15.0)
MCH: 27.5 pg (ref 26.0–34.0)
MCHC: 31.2 g/dL (ref 30.0–36.0)
MCV: 88.1 fL (ref 78.0–100.0)
Platelets: 234 10*3/uL (ref 150–400)
RBC: 3.53 MIL/uL — AB (ref 3.87–5.11)
RDW: 16.2 % — ABNORMAL HIGH (ref 11.5–15.5)
WBC: 10.7 10*3/uL — ABNORMAL HIGH (ref 4.0–10.5)

## 2017-03-23 LAB — BASIC METABOLIC PANEL
ANION GAP: 4 — AB (ref 5–15)
BUN: 11 mg/dL (ref 6–20)
CHLORIDE: 107 mmol/L (ref 101–111)
CO2: 30 mmol/L (ref 22–32)
Calcium: 8.3 mg/dL — ABNORMAL LOW (ref 8.9–10.3)
Creatinine, Ser: 0.61 mg/dL (ref 0.44–1.00)
GFR calc Af Amer: >60 mL/min (ref >60)
GFR calc non Af Amer: >60 mL/min (ref >60)
GLUCOSE: 124 mg/dL — AB (ref 65–99)
Potassium: 3.4 mmol/L — ABNORMAL LOW (ref 3.5–5.1)
Sodium: 141 mmol/L (ref 135–145)

## 2017-03-23 LAB — COMPREHENSIVE METABOLIC PANEL
ALT: 43 U/L (ref 14–54)
AST: 24 U/L (ref 15–41)
Albumin: 2.8 g/dL — ABNORMAL LOW (ref 3.5–5.0)
Alkaline Phosphatase: 78 U/L (ref 38–126)
Anion gap: 4 — ABNORMAL LOW (ref 5–15)
BUN: 11 mg/dL (ref 6–20)
CHLORIDE: 108 mmol/L (ref 101–111)
CO2: 31 mmol/L (ref 22–32)
CREATININE: 0.73 mg/dL (ref 0.44–1.00)
Calcium: 8.2 mg/dL — ABNORMAL LOW (ref 8.9–10.3)
GFR calc non Af Amer: >60 mL/min (ref >60)
Glucose, Bld: 92 mg/dL (ref 65–99)
Potassium: 2.8 mmol/L — ABNORMAL LOW (ref 3.5–5.1)
SODIUM: 143 mmol/L (ref 135–145)
Total Bilirubin: 0.3 mg/dL (ref 0.3–1.2)
Total Protein: 5.9 g/dL — ABNORMAL LOW (ref 6.5–8.1)

## 2017-03-23 LAB — GLUCOSE, CAPILLARY
Glucose-Capillary: 88 mg/dL (ref 65–99)
Glucose-Capillary: 194 mg/dL — ABNORMAL HIGH (ref 65–99)

## 2017-03-23 MED ORDER — POTASSIUM CHLORIDE 10 MEQ/100ML IV SOLN
10.0000 meq | INTRAVENOUS | Status: AC
  Administered 2017-03-23 (×4): 10 meq via INTRAVENOUS
  Filled 2017-03-23 (×4): qty 100

## 2017-03-23 MED ORDER — POTASSIUM CHLORIDE CRYS ER 20 MEQ PO TBCR
20.0000 meq | EXTENDED_RELEASE_TABLET | Freq: Two times a day (BID) | ORAL | Status: AC
  Administered 2017-03-23 – 2017-03-24 (×4): 20 meq via ORAL
  Filled 2017-03-23 (×4): qty 1

## 2017-03-23 MED ORDER — TECHNETIUM TC 99M MEBROFENIN IV KIT: 5.1000 | PACK | Freq: Once | INTRAVENOUS | Status: DC | PRN

## 2017-03-23 NOTE — Care Management Important Message (Addendum)
Important Message  Patient Details IM Letter given to Suzanne/Case Manager to present to the Patient Name: Leslie Patton MRN: [id number] Date of Birth: 03/25/1945   Medicare Important Message Given:  Yes    Caren MacadamFuller, [place] 03/23/2017, 10:51 AMImportant Message  Patient Details  Name: Leslie Patton MRN: [id number] Date of Birth: 10/02/1945   Medicare Important Message Given:  Yes    Caren MacadamFuller, Kenidee Cregan 03/23/2017, 10:51 AM

## 2017-03-23 NOTE — Progress Notes (Signed)
[facility] Progress Note  3 Days Post-Op  Subjective: CC-  Unchanged from yesterday. Continues to have some abdominal pain, but it is well controlled. Denies n/v. Not taking in much of her diet. Passing small amount of flatus, feels bloated. Having 4-5 loose stool daily.  PT/OT recommending HH PT.  Objective: Vital signs in last 24 hours: Temp:  [97.5 F (36.4 C)-98.6 F (37 C)] 97.7 F (36.5 C) (01/22 0749) Pulse Rate:  [90-104] 97 (01/22 0749) Resp:  [18] 18 (01/22 0749) BP: (110-136)/(76-87) 136/87 (01/22 0749) SpO2:  [91 %-94 %] 91 % (01/22 0749) Last BM Date: 03/23/17  Intake/Output from previous day: 01/21 0701 - 01/22 0700 In: 1380 [P.O.:1080; IV Piggyback:300] Out: 1590 [Urine:1000; Drains:590] Intake/Output this shift: No intake/output data recorded.  PE: Gen:  Alert, NAD, pleasant HEENT: EOM's intact, pupils equal and round Card:  RRR, no M/G/R heard Pulm:  CTAB, no W/R/R, effort normal Abd: Soft, distended, nontender, few BS heard, lap incisions C/D/I, drain with bilious output Skin: no rashes noted, warm and dry  Lab Results:  Recent Labs    03/21/17 0939 03/23/17 0519  WBC 15.4* 10.7*  HGB 9.8* 9.7*  HCT 31.4* 31.1*  PLT 190 234   BMET Recent Labs    03/21/17 0939 03/23/17 0519  NA 138 143  K 3.5 2.8*  CL 108 108  CO2 23 31  GLUCOSE 208* 92  BUN 13 11  CREATININE 0.67 0.73  CALCIUM 8.0* 8.2*   PT/INR No results for input(s): LABPROT, INR in the last 72 hours. CMP     Component Value Date/Time   NA 143 03/23/2017 0519   K 2.8 (L) 03/23/2017 0519   CL 108 03/23/2017 0519   CO2 31 03/23/2017 0519   GLUCOSE 92 03/23/2017 0519   BUN 11 03/23/2017 0519   CREATININE 0.73 03/23/2017 0519   CALCIUM 8.2 (L) 03/23/2017 0519   PROT 5.9 (L) 03/23/2017 0519   ALBUMIN 2.8 (L) 03/23/2017 0519   AST 24 03/23/2017 0519   ALT 43 03/23/2017 0519   ALKPHOS 78 03/23/2017 0519   BILITOT 0.3 03/23/2017 0519   GFRNONAA >60 03/23/2017  0519   GFRAA >60 03/23/2017 0519   Lipase     Component Value Date/Time   LIPASE 47 03/19/2017 2330       Studies/Results: No results found.  Anti-infectives: Anti-infectives (From admission, onward)   Start     Dose/Rate Route Frequency Ordered Stop   03/20/17 1400  metroNIDAZOLE (FLAGYL) IVPB 500 mg     500 mg 100 mL/hr over 60 Minutes Intravenous Every 8 hours 03/20/17 0331 03/27/17 1359   03/20/17 0600  clindamycin (CLEOCIN) IVPB 900 mg     900 mg 100 mL/hr over 30 Minutes Intravenous On call to O.R. 03/20/17 0312 03/20/17 1100   03/20/17 0600  gentamicin (GARAMYCIN) 390 mg in dextrose 5 % 100 mL IVPB     5 mg/kg  77.1 kg 109.8 mL/hr over 60 Minutes Intravenous On call to O.R. 03/20/17 0312 03/20/17 1027   03/20/17 0400  ciprofloxacin (CIPRO) IVPB 400 mg     400 mg 200 mL/hr over 60 Minutes Intravenous 2 times daily 03/20/17 0331 03/27/17 0959   03/20/17 0115  cefTRIAXone (ROCEPHIN) 1 g in dextrose 5 % 50 mL IVPB     1 g 100 mL/hr over 30 Minutes Intravenous  Once 03/20/17 0110 03/20/17 0441   03/20/17 0115  metroNIDAZOLE (FLAGYL) IVPB 500 mg  Status:  Discontinued  500 mg 100 mL/hr over 60 Minutes Intravenous  Once 03/20/17 0110 03/21/17 0809       Assessment/Plan Acute Gangrenous Cholecystitis with perforation, Biliary peritonitis, Liver: normal S/p Laparoscopic cholecystectomy, LOA, hepatic repair 1/19 Dr. Michaell Cowing - POD 3 - WBC trending down 10.7, afebrile - drain with 590cc/24hr bilious output  ID - cipro/flagyl 1/19>> (planning IV abx 1 week minimum for bilious peritonitis) FEN - dysphagia 1 VTE - SCDs, lovenox Foley - out Follow up - Dr. Michaell Cowing  Plan - Check c diff. Drain output still bilious and increasing in volume; discussed with MD, will order HIDA to evaluate. Continue therapies.   LOS: 3 days    Franne Forts , [facility] 03/23/2017, 9:05 AM Pager: [phone] Consults: [phone] Mon-Fri 7:00 am-4:30 pm Sat-Sun  7:00 am-11:30 am

## 2017-03-24 ENCOUNTER — Inpatient Hospital Stay (HOSPITAL_COMMUNITY): Payer: Medicare Other

## 2017-03-24 LAB — BASIC METABOLIC PANEL
ANION GAP: 5 (ref 5–15)
BUN: 8 mg/dL (ref 6–20)
CALCIUM: 8.1 mg/dL — AB (ref 8.9–10.3)
CO2: 29 mmol/L (ref 22–32)
Chloride: 105 mmol/L (ref 101–111)
Creatinine, Ser: 0.7 mg/dL (ref 0.44–1.00)
GFR calc Af Amer: >60 mL/min (ref >60)
GLUCOSE: 101 mg/dL — AB (ref 65–99)
Potassium: 3.4 mmol/L — ABNORMAL LOW (ref 3.5–5.1)
Sodium: 139 mmol/L (ref 135–145)

## 2017-03-24 LAB — CBC
HEMATOCRIT: 30 % — AB (ref 36.0–46.0)
Hemoglobin: 9.5 g/dL — ABNORMAL LOW (ref 12.0–15.0)
MCH: 27.8 pg (ref 26.0–34.0)
MCHC: 31.7 g/dL (ref 30.0–36.0)
MCV: 87.7 fL (ref 78.0–100.0)
PLATELETS: 228 10*3/uL (ref 150–400)
RBC: 3.42 MIL/uL — ABNORMAL LOW (ref 3.87–5.11)
RDW: 16.3 % — AB (ref 11.5–15.5)
WBC: 12.9 10*3/uL — AB (ref 4.0–10.5)

## 2017-03-24 LAB — C DIFFICILE QUICK SCREEN W PCR REFLEX
C DIFFICILE (CDIFF) TOXIN: NEGATIVE
C Diff antigen: NEGATIVE
C Diff interpretation: NOT DETECTED

## 2017-03-24 MED ORDER — SACCHAROMYCES BOULARDII 250 MG PO CAPS
250.0000 mg | ORAL_CAPSULE | Freq: Two times a day (BID) | ORAL | Status: DC
  Administered 2017-03-24 – 2017-03-29 (×10): 250 mg via ORAL
  Filled 2017-03-24 (×11): qty 1

## 2017-03-24 MED ORDER — HYDROMORPHONE HCL 1 MG/ML IJ SOLN: 0.5000 mg | INTRAMUSCULAR | Status: DC | PRN

## 2017-03-24 NOTE — Progress Notes (Signed)
[facility] Progress Note  4 Days Post-Op  Subjective: CC-  Patient states that she feels "meh." No better or worse than yesterday. She reports a suppressed appetite and nausea with PO intake, no emesis. Abdominal pain controlled with tylenol. She is passing little flatus and having loose BMs. States that she had 1 BM yesterday that was a little more formed, but this morning it was watery. Denies SOB, CP, cough, dysuria.  HIDA yesterday negative for leak.  Objective: Vital signs in last 24 hours: Temp:  [98.2 F (36.8 C)-99.1 F (37.3 C)] 98.7 F (37.1 C) (01/23 0615) Pulse Rate:  [81-103] 81 (01/23 0615) Resp:  [18] 18 (01/23 0615) BP: (122-143)/(80-87) 141/81 (01/23 0615) SpO2:  [93 %-97 %] 97 % (01/23 0615) Last BM Date: 03/24/17  Intake/Output from previous day: 01/22 0701 - 01/23 0700 In: 1120 [P.O.:720; IV Piggyback:400] Out: 1878 [Urine:1400; Drains:475; Stool:3] Intake/Output this shift: No intake/output data recorded.  PE: Gen:  Alert, NAD, pleasant HEENT: EOM's intact, pupils equal and round Card:  RRR, no M/G/R heard Pulm:  diminished breath sounds bilateral bases, no W/R/R, effort normal Abd: Soft, distended, nontender, + BS, lap incisions C/D/I, drain with bilious output Skin: no rashes noted, warm and dry  Lab Results:  Recent Labs    03/23/17 0519 03/24/17 0708  WBC 10.7* 12.9*  HGB 9.7* 9.5*  HCT 31.1* 30.0*  PLT 234 228   BMET Recent Labs    03/23/17 1400 03/24/17 0708  NA 141 139  K 3.4* 3.4*  CL 107 105  CO2 30 29  GLUCOSE 124* 101*  BUN 11 8  CREATININE 0.61 0.70  CALCIUM 8.3* 8.1*   PT/INR No results for input(s): LABPROT, INR in the last 72 hours. CMP     Component Value Date/Time   NA 139 03/24/2017 0708   K 3.4 (L) 03/24/2017 0708   CL 105 03/24/2017 0708   CO2 29 03/24/2017 0708   GLUCOSE 101 (H) 03/24/2017 0708   BUN 8 03/24/2017 0708   CREATININE 0.70 03/24/2017 0708   CALCIUM 8.1 (L) 03/24/2017 0708    PROT 5.9 (L) 03/23/2017 0519   ALBUMIN 2.8 (L) 03/23/2017 0519   AST 24 03/23/2017 0519   ALT 43 03/23/2017 0519   ALKPHOS 78 03/23/2017 0519   BILITOT 0.3 03/23/2017 0519   GFRNONAA >60 03/24/2017 0708   GFRAA >60 03/24/2017 0708   Lipase     Component Value Date/Time   LIPASE 47 03/19/2017 2330       Studies/Results: Nm Hepatobiliary Liver Func  Result Date: 03/23/2017 CLINICAL DATA:  Recent laparoscopic cholecystectomy with pain EXAM: NUCLEAR MEDICINE HEPATOBILIARY IMAGING TECHNIQUE: Sequential images of the abdomen were obtained out to 60 minutes following intravenous administration of radiopharmaceutical. RADIOPHARMACEUTICALS:  5.1 mCi Tc-70m  Choletec IV COMPARISON:  None. FINDINGS: Liver uptake of radiotracer is normal. There is prompt visualization of small bowel indicating patency of the common bile duct. Gallbladder is noted to be absent. There is no ectopic radiotracer to suggest bile leak. IMPRESSION: No bile leak demonstrated. Gallbladder absent. Common bile duct patent as is evidenced by prompt visualization of small bowel. Electronically Signed   By: Bretta Bang III M.D.   On: 03/23/2017 19:45    Anti-infectives: Anti-infectives (From admission, onward)   Start     Dose/Rate Route Frequency Ordered Stop   03/20/17 1400  metroNIDAZOLE (FLAGYL) IVPB 500 mg     500 mg 100 mL/hr over 60 Minutes Intravenous Every 8 hours 03/20/17 0331  03/27/17 1359   03/20/17 0600  clindamycin (CLEOCIN) IVPB 900 mg     900 mg 100 mL/hr over 30 Minutes Intravenous On call to O.R. 03/20/17 0312 03/20/17 1100   03/20/17 0600  gentamicin (GARAMYCIN) 390 mg in dextrose 5 % 100 mL IVPB     5 mg/kg  77.1 kg 109.8 mL/hr over 60 Minutes Intravenous On call to O.R. 03/20/17 0312 03/20/17 1027   03/20/17 0400  ciprofloxacin (CIPRO) IVPB 400 mg     400 mg 200 mL/hr over 60 Minutes Intravenous 2 times daily 03/20/17 0331 03/27/17 0959   03/20/17 0115  cefTRIAXone (ROCEPHIN) 1 g in  dextrose 5 % 50 mL IVPB     1 g 100 mL/hr over 30 Minutes Intravenous  Once 03/20/17 0110 03/20/17 0441   03/20/17 0115  metroNIDAZOLE (FLAGYL) IVPB 500 mg  Status:  Discontinued     500 mg 100 mL/hr over 60 Minutes Intravenous  Once 03/20/17 0110 03/21/17 0809       Assessment/Plan Acute Gangrenous Cholecystitiswith perforation, Biliary peritonitis, Liver:normal S/p Laparoscopic cholecystectomy, LOA, hepatic repair 1/19 Dr. Michaell CowingGross - POD 4 - WBC slightly up 12.9, TMAX 99.1 - drain with 475cc/24hr bilious output - HIDA 1/22 negative for leak  ID - cipro/flagyl 1/19>>day#5 (planning IV abx 1 week minimum for bilious peritonitis) FEN - dysphagia 1 VTE - SCDs, lovenox Foley - out Follow up - Dr. Michaell CowingGross  Plan - WBC slightly up, low grade fever over night. C diff pending, check ua and CXR. Continue therapies. Encourage more OOB/IS today. Replace potassium. Continue drain and IV antibiotics. Labs in AM.   LOS: 4 days    Franne FortsBrooke A Prue Lingenfelter , [facility] 03/24/2017, 9:20 AM Pager: [phone] Consults: [phone] Mon-Fri 7:00 am-4:30 pm Sat-Sun 7:00 am-11:30 am

## 2017-03-24 NOTE — Progress Notes (Signed)
OT Cancellation Note  Patient Details Name: Leslie Patton MRN: [id number] DOB: 11/22/1945   Cancelled Treatment:    Reason Eval/Treat Not Completed: Other (comment). NT reports that pt just got back to bed after walking in hall. Will check back another day.  Tyri Elmore 03/24/2017, 2:49 PM  Marica OtterMaryellen Ralf [place], OTR/L [phone] 03/24/2017

## 2017-03-24 NOTE — Progress Notes (Signed)
Discharge planning, spoke with patient at beside. [facility] to [facility] and [facility]. Contacted [facility] for referral. Also needs RW and 3-n-1, contacted [facility] to deliver to room. [phone]

## 2017-03-24 NOTE — Progress Notes (Signed)
PT Cancellation Note  Patient Details Name: Leslie Patton MRN: [id number] DOB: 03/02/1945   Cancelled Treatment:     Pt stated she "walked the unit earlier".  [facility] and 3[facility]1   Felecia ShellingLori Beth Spackman  PTA [facility]  Acute  Rehab Pager      [phone]

## 2017-03-25 LAB — BASIC METABOLIC PANEL
Anion gap: 6 (ref 5–15)
BUN: 9 mg/dL (ref 6–20)
CALCIUM: 8.3 mg/dL — AB (ref 8.9–10.3)
CO2: 29 mmol/L (ref 22–32)
Chloride: 106 mmol/L (ref 101–111)
Creatinine, Ser: 0.6 mg/dL (ref 0.44–1.00)
GFR calc Af Amer: >60 mL/min (ref >60)
GLUCOSE: 101 mg/dL — AB (ref 65–99)
Potassium: 3.8 mmol/L (ref 3.5–5.1)
Sodium: 141 mmol/L (ref 135–145)

## 2017-03-25 LAB — CBC
HEMATOCRIT: 28.2 % — AB (ref 36.0–46.0)
Hemoglobin: 9 g/dL — ABNORMAL LOW (ref 12.0–15.0)
MCH: 27.8 pg (ref 26.0–34.0)
MCHC: 31.9 g/dL (ref 30.0–36.0)
MCV: 87 fL (ref 78.0–100.0)
Platelets: 295 10*3/uL (ref 150–400)
RBC: 3.24 MIL/uL — ABNORMAL LOW (ref 3.87–5.11)
RDW: 16.3 % — AB (ref 11.5–15.5)
WBC: 16 10*3/uL — ABNORMAL HIGH (ref 4.0–10.5)

## 2017-03-25 LAB — URINALYSIS, ROUTINE W REFLEX MICROSCOPIC
Bilirubin Urine: NEGATIVE
GLUCOSE, UA: NEGATIVE mg/dL
HGB URINE DIPSTICK: NEGATIVE
KETONES UR: NEGATIVE mg/dL
NITRITE: NEGATIVE
PROTEIN: NEGATIVE mg/dL
Specific Gravity, Urine: 1.012 (ref 1.005–1.030)
pH: 7 (ref 5.0–8.0)

## 2017-03-25 NOTE — Progress Notes (Signed)
Physical Therapy Treatment Patient Details Name: Leslie Patton MRN: [id number] DOB: May 20, 1945 Today's Date: 03/25/2017    History of Present Illness Patient is a 72 y/o female admitted with acute cholecystitis now s/p lap chole 03/20/17.  PMH positive for OSA, OCD, anxiety, HTN and gait difficutly.     PT Comments    Limited Tx session due to fatigue.  See mobility details below.   Follow Up Recommendations  Home health PT;Supervision/Assistance - 24 hour     Equipment Recommendations  Rolling walker with 5" wheels;3in1 (PT)    Recommendations for Other Services       Precautions / Restrictions Precautions Precautions: Fall Restrictions Weight Bearing Restrictions: No    Mobility  Bed Mobility               General bed mobility comments: OOB in bathroom  Transfers Overall transfer level: Needs assistance Equipment used: Rolling walker (2 wheeled) Transfers: Sit to/from [employer] Transfers Sit to Stand: Supervision Stand pivot transfers: Supervision       General transfer comment: cues for safety  Ambulation/Gait Ambulation/Gait assistance: Supervision Ambulation Distance (Feet): 8 Feet Assistive device: None(holding IV pole) Gait Pattern/deviations: Step-through pattern;Decreased stride length;Trunk flexed     General Gait Details: found pt unassisted naked standing in bathroom washing up loose BM.  Too fatigued to amb in hallway after assisting further with a new gaown.   Stairs            Wheelchair Mobility    Modified Rankin (Stroke Patients Only)       Balance                                            Cognition Arousal/Alertness: Awake/alert Behavior During Therapy: WFL for tasks assessed/performed Overall Cognitive Status: Within Functional Limits for tasks assessed                                        Exercises      General Comments        Pertinent Vitals/Pain Pain  Assessment: No/denies pain Pain Location: "no not really"    Home Living                      Prior Function            PT Goals (current goals can now be found in the care plan section) Progress towards PT goals: Progressing toward goals    Frequency    Min 3X/week      PT Plan Current plan remains appropriate    Co-evaluation              AM-PAC PT "6 Clicks" Daily Activity  Outcome Measure  Difficulty turning over in bed (including adjusting bedclothes, sheets and blankets)?: A Little Difficulty moving from lying on back to sitting on the side of the bed? : A Little Difficulty sitting down on and standing up from a chair with arms (e.g., wheelchair, bedside commode, etc,.)?: Unable Help needed moving to and from a bed to chair (including a wheelchair)?: A Little Help needed walking in hospital room?: A Little Help needed climbing 3-5 steps with a railing? : A Lot 6 Click Score: 15    End of Session Equipment Utilized During Treatment: Gait  belt Activity Tolerance: Patient limited by fatigue Patient left: in chair;with call bell/phone within reach   PT Visit Diagnosis: Other abnormalities of gait and mobility (R26.89)     Time: [id number] PT Time Calculation (min) (ACUTE ONLY): 14 min  Charges:  $Gait Training: 8-22 mins                    G Codes:       Felecia ShellingLori Nolie Bignell  PTA [facility]  Acute  Rehab Pager      [phone]

## 2017-03-25 NOTE — Progress Notes (Signed)
Occupational Therapy Treatment Patient Details Name: Candida Peelingatricia Pennypacker MRN: [id number] DOB: 01/12/1946 Today's Date: 03/25/2017    History of present illness Patient is a 72 y/o female admitted with acute cholecystitis now s/p lap chole 03/20/17.  PMH positive for OSA, OCD, anxiety, HTN and gait difficutly.    OT comments  Completed education this session  Follow Up Recommendations  No OT follow up    Equipment Recommendations  None recommended by OT    Recommendations for Other Services      Precautions / Restrictions Precautions Precautions: Fall Restrictions Weight Bearing Restrictions: No       Mobility Bed Mobility               General bed mobility comments: supervision back to bed  Transfers Overall transfer level: Needs assistance Equipment used: Rolling walker (2 wheeled) Transfers: Sit to/from [employer]Stand;Stand Pivot Transfers Sit to Stand: Supervision;Modified independent (Device/Increase time) Stand pivot transfers: Supervision       General transfer comment: cues for safety    Balance                                           ADL either performed or assessed with clinical judgement   ADL                           Toilet Transfer: Supervision/safety;Modified Independent;Ambulation;BSC;RW   Toileting- Clothing Manipulation and Hygiene: Set up;Sit to/from stand         General ADL Comments: Pt was sitting up and fatiqued. Used 3:1 commode prior to returning to bed. She states that a 3:1 was delivered.  Pt does not feel she needs to practice a shower transfer. Educated that she could use this as a seat in the shower and husband should dry legs after use to avoid rusting     Vision       Perception     Praxis      Cognition Arousal/Alertness: Awake/alert Behavior During Therapy: WFL for tasks assessed/performed Overall Cognitive Status: Within Functional Limits for tasks assessed                                           Exercises     Shoulder Instructions       General Comments      Pertinent Vitals/ Pain       Pain Assessment: No/denies pain Pain Score: 0-No pain Pain Location: "no not really"  Home Living                                          Prior Functioning/Environment              Frequency           Progress Toward Goals  OT Goals(current goals can now be found in the care plan section)  Progress towards OT goals: Progressing toward goals(does not need continued OT)     Plan      Co-evaluation                 AM-PAC PT "6 Clicks" Daily Activity     Outcome Measure  Help from another person eating meals?: None Help from another person taking care of personal grooming?: A Little Help from another person toileting, which includes using toliet, bedpan, or urinal?: A Little Help from another person bathing (including washing, rinsing, drying)?: A Little Help from another person to put on and taking off regular upper body clothing?: A Little Help from another person to put on and taking off regular lower body clothing?: A Little 6 Click Score: 19    End of Session        Activity Tolerance Patient limited by fatigue   Patient Left in bed;with call bell/phone within reach   Nurse Communication          Time: 1610-9604 OT Time Calculation (min): 16 min  Charges: OT General Charges $OT Visit: 1 Visit OT Treatments $Self Care/Home Management : 8-22 mins  Marica Otter, OTR/L [phone] 03/25/2017   Jolinda Pinkstaff 03/25/2017, 3:19 PM

## 2017-03-25 NOTE — Progress Notes (Signed)
[facility] Progress Note  5 Days Post-Op  Subjective: CC-  Patient states that she feels much better today. Slept well last night. Denies abdominal pain. Tolerating diet without nausea or vomiting. Only had 2 BMs yesterday, still loose. She has been ambulating in the halls. Denies cough, SOB, CP, dysuria.  Objective: Vital signs in last 24 hours: Temp:  [97.6 F (36.4 C)-98.5 F (36.9 C)] 98.2 F (36.8 C) (01/24 0500) Pulse Rate:  [95-105] 100 (01/24 0500) Resp:  [16-18] 16 (01/24 0500) BP: (128-155)/(82-104) 143/82 (01/24 0500) SpO2:  [90 %-99 %] 90 % (01/24 0500) Last BM Date: 03/24/17  Intake/Output from previous day: 01/23 0701 - 01/24 0700 In: 12316.3 [P.O.:360; I.V.:11356.3; IV Piggyback:600] Out: 1215 [Urine:800; Drains:415] Intake/Output this shift: No intake/output data recorded.  PE: Gen: Alert, NAD, pleasant HEENT: EOM's intact, pupils equal and round Card: RRR, no M/G/R heard Pulm: diminished breath sounds bilateral bases, no W/R/R, effort normal Abd: Soft, mild distension, nontender,+BS in all 4 quadrants,lapincisions C/D/I, drain with bile tinged output Skin: no rashes noted, warm and dry  Lab Results:  Recent Labs    03/24/17 0708 03/25/17 0436  WBC 12.9* 16.0*  HGB 9.5* 9.0*  HCT 30.0* 28.2*  PLT 228 295   BMET Recent Labs    03/24/17 0708 03/25/17 0436  NA 139 141  K 3.4* 3.8  CL 105 106  CO2 29 29  GLUCOSE 101* 101*  BUN 8 9  CREATININE 0.70 0.60  CALCIUM 8.1* 8.3*   PT/INR No results for input(s): LABPROT, INR in the last 72 hours. CMP     Component Value Date/Time   NA 141 03/25/2017 0436   K 3.8 03/25/2017 0436   CL 106 03/25/2017 0436   CO2 29 03/25/2017 0436   GLUCOSE 101 (H) 03/25/2017 0436   BUN 9 03/25/2017 0436   CREATININE 0.60 03/25/2017 0436   CALCIUM 8.3 (L) 03/25/2017 0436   PROT 5.9 (L) 03/23/2017 0519   ALBUMIN 2.8 (L) 03/23/2017 0519   AST 24 03/23/2017 0519   ALT 43 03/23/2017 0519   ALKPHOS 78 03/23/2017 0519   BILITOT 0.3 03/23/2017 0519   GFRNONAA >60 03/25/2017 0436   GFRAA >60 03/25/2017 0436   Lipase     Component Value Date/Time   LIPASE 47 03/19/2017 2330       Studies/Results: Nm Hepatobiliary Liver Func  Result Date: 03/23/2017 CLINICAL DATA:  Recent laparoscopic cholecystectomy with pain EXAM: NUCLEAR MEDICINE HEPATOBILIARY IMAGING TECHNIQUE: Sequential images of the abdomen were obtained out to 60 minutes following intravenous administration of radiopharmaceutical. RADIOPHARMACEUTICALS:  5.1 mCi Tc-73m  Choletec IV COMPARISON:  None. FINDINGS: Liver uptake of radiotracer is normal. There is prompt visualization of small bowel indicating patency of the common bile duct. Gallbladder is noted to be absent. There is no ectopic radiotracer to suggest bile leak. IMPRESSION: No bile leak demonstrated. Gallbladder absent. Common bile duct patent as is evidenced by prompt visualization of small bowel. Electronically Signed   By: Bretta Bang III M.D.   On: 03/23/2017 19:45   Dg Chest Port 1 View  Result Date: 03/24/2017 CLINICAL DATA:  Leukocytosis. EXAM: PORTABLE CHEST 1 VIEW COMPARISON:  Chest CT 01/28/2015 FINDINGS: There is mild elevation of the right hemidiaphragm. The cardiac silhouette is partially obscured but grossly normal in size. Soft tissue density with convex contour left of midline in the lower mediastinum likely corresponds to the moderate-sized hiatal hernia on prior CT. There is mild adjacent streaky opacity in the left lower  lobe. There may be a small left pleural effusion. The right lung is clear. No pneumothorax is identified. The osseous structures are unremarkable. IMPRESSION: 1. Streaky left lower lobe opacity which may reflect atelectasis or pneumonia. Possible small left pleural effusion. 2. Hiatal hernia. Electronically Signed   By: Sebastian AcheAllen  Grady M.D.   On: 03/24/2017 12:16    Anti-infectives: Anti-infectives (From admission, onward)    Start     Dose/Rate Route Frequency Ordered Stop   03/20/17 1400  metroNIDAZOLE (FLAGYL) IVPB 500 mg     500 mg 100 mL/hr over 60 Minutes Intravenous Every 8 hours 03/20/17 0331 03/27/17 1359   03/20/17 0600  clindamycin (CLEOCIN) IVPB 900 mg     900 mg 100 mL/hr over 30 Minutes Intravenous On call to O.R. 03/20/17 0312 03/20/17 1100   03/20/17 0600  gentamicin (GARAMYCIN) 390 mg in dextrose 5 % 100 mL IVPB     5 mg/kg  77.1 kg 109.8 mL/hr over 60 Minutes Intravenous On call to O.R. 03/20/17 0312 03/20/17 1027   03/20/17 0400  ciprofloxacin (CIPRO) IVPB 400 mg     400 mg 200 mL/hr over 60 Minutes Intravenous 2 times daily 03/20/17 0331 03/27/17 0959   03/20/17 0115  cefTRIAXone (ROCEPHIN) 1 g in dextrose 5 % 50 mL IVPB     1 g 100 mL/hr over 30 Minutes Intravenous  Once 03/20/17 0110 03/20/17 0441   03/20/17 0115  metroNIDAZOLE (FLAGYL) IVPB 500 mg  Status:  Discontinued     500 mg 100 mL/hr over 60 Minutes Intravenous  Once 03/20/17 0110 03/21/17 0809       Assessment/Plan Acute Gangrenous Cholecystitiswith perforation,Biliary peritonitis,Liver:normal S/pLaparoscopic cholecystectomy, LOA,hepatic repair1/19 Dr. Michaell CowingGross -POD 5 - WBC up again 16, afebrile. c diff negative, u/a negative, CXR mild atelectasis possible PNA - drain output decreasing 415cc/24hr bile tinged but lighter - HIDA 1/22 negative for leak  ID -cipro/flagyl 1/19>>day#6 (planning IV abx 1 week minimum for bilious peritonitis) FEN - KVO IVF, soft diet, ensure VTE -SCDs, lovenox Foley -out Follow up -Dr. Michaell CowingGross  Plan- WBC up again but patient afebrile. Abdomen completely benign. Advance to soft diet. Continue ambulating. Encouraged use of IS more frequently. Continue drain and IV antibiotics. Labs in AM.   LOS: 5 days    Franne FortsBrooke A Evangelynn Lochridge , [facility] 03/25/2017, 8:54 AM Pager: [phone] Consults: [phone] Mon-Fri 7:00 am-4:30 pm Sat-Sun 7:00 am-11:30 am

## 2017-03-26 ENCOUNTER — Encounter (HOSPITAL_COMMUNITY): Payer: Self-pay | Admitting: Radiology

## 2017-03-26 ENCOUNTER — Inpatient Hospital Stay (HOSPITAL_COMMUNITY): Payer: Medicare Other

## 2017-03-26 LAB — BASIC METABOLIC PANEL
ANION GAP: 8 (ref 5–15)
BUN: 8 mg/dL (ref 6–20)
CHLORIDE: 103 mmol/L (ref 101–111)
CO2: 30 mmol/L (ref 22–32)
Calcium: 8.5 mg/dL — ABNORMAL LOW (ref 8.9–10.3)
Creatinine, Ser: 0.74 mg/dL (ref 0.44–1.00)
GFR calc non Af Amer: >60 mL/min (ref >60)
Glucose, Bld: 99 mg/dL (ref 65–99)
POTASSIUM: 3.5 mmol/L (ref 3.5–5.1)
SODIUM: 141 mmol/L (ref 135–145)

## 2017-03-26 LAB — CBC
HCT: 32.4 % — ABNORMAL LOW (ref 36.0–46.0)
Hemoglobin: 10 g/dL — ABNORMAL LOW (ref 12.0–15.0)
MCH: 27.1 pg (ref 26.0–34.0)
MCHC: 30.9 g/dL (ref 30.0–36.0)
MCV: 87.8 fL (ref 78.0–100.0)
Platelets: 376 10*3/uL (ref 150–400)
RBC: 3.69 MIL/uL — AB (ref 3.87–5.11)
RDW: 16.6 % — ABNORMAL HIGH (ref 11.5–15.5)
WBC: 18.8 10*3/uL — AB (ref 4.0–10.5)

## 2017-03-26 LAB — PROTIME-INR
INR: 1.03
PROTHROMBIN TIME: 13.4 s (ref 11.4–15.2)

## 2017-03-26 MED ORDER — IOPAMIDOL (ISOVUE-300) INJECTION 61%
15.0000 mL | Freq: Once | INTRAVENOUS | Status: DC | PRN
  Administered 2017-03-26: 30 mL via ORAL
  Filled 2017-03-26: qty 30

## 2017-03-26 MED ORDER — IOPAMIDOL (ISOVUE-300) INJECTION 61%
INTRAVENOUS | Status: AC
  Administered 2017-03-26: 30 mL via ORAL
  Filled 2017-03-26: qty 30

## 2017-03-26 MED ORDER — IOPAMIDOL (ISOVUE-300) INJECTION 61%
100.0000 mL | Freq: Once | INTRAVENOUS | Status: AC | PRN
  Administered 2017-03-26: 100 mL via INTRAVENOUS

## 2017-03-26 MED ORDER — IOPAMIDOL (ISOVUE-300) INJECTION 61%
INTRAVENOUS | Status: AC
  Filled 2017-03-26: qty 100

## 2017-03-26 NOTE — Progress Notes (Signed)
[facility] Progress Note  6 Days Post-Op  Subjective: CC-  No complaints, states that she feels a little better than yesterday. Denies abdominal pain, nausea, or vomiting. Tolerating a diet and having bowel function. Denies cough, SOB, CP, dysuria. WBC up to 18.8 today, afebrile.  Objective: Vital signs in last 24 hours: Temp:  [98.4 F (36.9 C)-98.6 F (37 C)] 98.4 F (36.9 C) (01/25 0535) Pulse Rate:  [90-97] 90 (01/25 0535) Resp:  [16] 16 (01/25 0535) BP: (126-141)/(80-86) 140/80 (01/25 0535) SpO2:  [94 %-96 %] 96 % (01/25 0535) Last BM Date: 03/26/17  Intake/Output from previous day: 01/24 0701 - 01/25 0700 In: 1435 [P.O.:960; I.V.:275; IV Piggyback:200] Out: 255 [Drains:255] Intake/Output this shift: No intake/output data recorded.  PE: Gen: Alert, NAD, pleasant HEENT: EOM's intact, pupils equal and round Card: RRR, no M/G/R heard Pulm:diminished breath sounds bilateral bases, no W/R/R, effort normal Abd: Soft, mild distension, nontender,+BS in all 4 quadrants,lapincisions C/D/I, drain with bile tinged output Skin: no rashes noted, warm and dry  Lab Results:  Recent Labs    03/25/17 0436 03/26/17 0450  WBC 16.0* 18.8*  HGB 9.0* 10.0*  HCT 28.2* 32.4*  PLT 295 376   BMET Recent Labs    03/25/17 0436 03/26/17 0450  NA 141 141  K 3.8 3.5  CL 106 103  CO2 29 30  GLUCOSE 101* 99  BUN 9 8  CREATININE 0.60 0.74  CALCIUM 8.3* 8.5*   PT/INR No results for input(s): LABPROT, INR in the last 72 hours. CMP     Component Value Date/Time   NA 141 03/26/2017 0450   K 3.5 03/26/2017 0450   CL 103 03/26/2017 0450   CO2 30 03/26/2017 0450   GLUCOSE 99 03/26/2017 0450   BUN 8 03/26/2017 0450   CREATININE 0.74 03/26/2017 0450   CALCIUM 8.5 (L) 03/26/2017 0450   PROT 5.9 (L) 03/23/2017 0519   ALBUMIN 2.8 (L) 03/23/2017 0519   AST 24 03/23/2017 0519   ALT 43 03/23/2017 0519   ALKPHOS 78 03/23/2017 0519   BILITOT 0.3 03/23/2017 0519    GFRNONAA >60 03/26/2017 0450   GFRAA >60 03/26/2017 0450   Lipase     Component Value Date/Time   LIPASE 47 03/19/2017 2330       Studies/Results: Dg Chest Port 1 View  Result Date: 03/24/2017 CLINICAL DATA:  Leukocytosis. EXAM: PORTABLE CHEST 1 VIEW COMPARISON:  Chest CT 01/28/2015 FINDINGS: There is mild elevation of the right hemidiaphragm. The cardiac silhouette is partially obscured but grossly normal in size. Soft tissue density with convex contour left of midline in the lower mediastinum likely corresponds to the moderate-sized hiatal hernia on prior CT. There is mild adjacent streaky opacity in the left lower lobe. There may be a small left pleural effusion. The right lung is clear. No pneumothorax is identified. The osseous structures are unremarkable. IMPRESSION: 1. Streaky left lower lobe opacity which may reflect atelectasis or pneumonia. Possible small left pleural effusion. 2. Hiatal hernia. Electronically Signed   By: Sebastian Ache M.D.   On: 03/24/2017 12:16    Anti-infectives: Anti-infectives (From admission, onward)   Start     Dose/Rate Route Frequency Ordered Stop   03/20/17 1400  metroNIDAZOLE (FLAGYL) IVPB 500 mg     500 mg 100 mL/hr over 60 Minutes Intravenous Every 8 hours 03/20/17 0331 03/27/17 1359   03/20/17 0600  clindamycin (CLEOCIN) IVPB 900 mg     900 mg 100 mL/hr over 30 Minutes Intravenous  On call to O.R. 03/20/17 [id number] 03/20/17 1100   03/20/17 0600  gentamicin (GARAMYCIN) 390 mg in dextrose 5 % 100 mL IVPB     5 mg/kg  77.1 kg 109.8 mL/hr over 60 Minutes Intravenous On call to O.R. 03/20/17 0312 03/20/17 1027   03/20/17 0400  ciprofloxacin (CIPRO) IVPB 400 mg     400 mg 200 mL/hr over 60 Minutes Intravenous 2 times daily 03/20/17 0331 03/27/17 0959   03/20/17 0115  cefTRIAXone (ROCEPHIN) 1 g in dextrose 5 % 50 mL IVPB     1 g 100 mL/hr over 30 Minutes Intravenous  Once 03/20/17 0110 03/20/17 0441   03/20/17 0115  metroNIDAZOLE (FLAGYL) IVPB 500  mg  Status:  Discontinued     500 mg 100 mL/hr over 60 Minutes Intravenous  Once 03/20/17 0110 03/21/17 0809       Assessment/Plan Acute Gangrenous Cholecystitiswith perforation,Biliary peritonitis,Liver:normal S/pLaparoscopic cholecystectomy, LOA,hepatic repair1/19 Dr. Michaell CowingGross -POD6 - WBCcontinues to rise 18.8, afebrile. c diff negative, u/a negative, CXR mild atelectasis possible PNA - drain output decreasing 255cc/24hr bile tinged  - HIDA 1/22 negative for leak  ID -cipro/flagyl 1/19>>day#7(planning IV abx 1 week minimum for bilious peritonitis) FEN - KVO IVF, soft diet, ensure VTE -SCDs, lovenox Foley -out Follow up -Dr. Michaell CowingGross  Plan-WBC up again, will check abdominal CT to evaluate for possible abscess formation. Continue drain and IV antibiotics. Labs in AM.   LOS: 6 days    Franne FortsBrooke A Meuth , [facility] 03/26/2017, 7:53 AM Pager: [phone] Consults: [phone] Mon-Fri 7:00 am-4:30 pm Sat-Sun 7:00 am-11:30 am

## 2017-03-26 NOTE — Progress Notes (Signed)
Request received for possible aspiration /drainage of hepatic fluid collection in patient post cholecystectomy.  Imaging studies were reviewed by Dr. Fredia SorrowYamagata.  The area appears intrahepatic, is very small and he feels does not warrant aspiration or drainage at this time.  Above discussed with [facility] PA.

## 2017-03-26 NOTE — Plan of Care (Signed)
  Activity: Risk for activity intolerance will decrease 03/26/2017 1335 - Progressing by William Daltonarpenter, Marius Betts L, RN   Nutrition: Adequate nutrition will be maintained 03/26/2017 1335 - Progressing by William Daltonarpenter, Shatarra Wehling L, RN

## 2017-03-27 LAB — CBC
HCT: 28.2 % — ABNORMAL LOW (ref 36.0–46.0)
Hemoglobin: 9 g/dL — ABNORMAL LOW (ref 12.0–15.0)
MCH: 27.4 pg (ref 26.0–34.0)
MCHC: 31.9 g/dL (ref 30.0–36.0)
MCV: 86 fL (ref 78.0–100.0)
PLATELETS: 401 10*3/uL — AB (ref 150–400)
RBC: 3.28 MIL/uL — AB (ref 3.87–5.11)
RDW: 16.6 % — AB (ref 11.5–15.5)
WBC: 16.1 10*3/uL — AB (ref 4.0–10.5)

## 2017-03-27 LAB — BASIC METABOLIC PANEL
Anion gap: 7 (ref 5–15)
BUN: 8 mg/dL (ref 6–20)
CALCIUM: 7.7 mg/dL — AB (ref 8.9–10.3)
CO2: 26 mmol/L (ref 22–32)
Chloride: 101 mmol/L (ref 101–111)
Creatinine, Ser: 0.83 mg/dL (ref 0.44–1.00)
GFR calc Af Amer: >60 mL/min (ref >60)
Glucose, Bld: 103 mg/dL — ABNORMAL HIGH (ref 65–99)
POTASSIUM: 3.3 mmol/L — AB (ref 3.5–5.1)
SODIUM: 134 mmol/L — AB (ref 135–145)

## 2017-03-27 NOTE — Progress Notes (Signed)
Patient ID: Leslie Patton, female   DOB: 04/16/1945, 72 y.o.   MRN: [id number] 7 Days Post-Op   Subjective: Continues to feel a little better each day.  Some gas cramps after eating but no constant abdominal pain.  No nausea.  Bowels moved.  Objective: Vital signs in last 24 hours: Temp:  [98.1 F (36.7 C)-98.4 F (36.9 C)] 98.4 F (36.9 C) (01/26 0556) Pulse Rate:  [95-98] 97 (01/26 0556) Resp:  [16-18] 16 (01/26 0556) BP: (127-130)/(86-91) 127/91 (01/26 0556) SpO2:  [94 %-95 %] 94 % (01/26 0556) Last BM Date: 03/26/17  Intake/Output from previous day: 01/25 0701 - 01/26 0700 In: 1980 [P.O.:1720; I.V.:60; IV Piggyback:200] Out: 3175 [Urine:2650; Drains:325; Stool:200] Intake/Output this shift: Total I/O In: -  Out: 500 [Urine:500]  General appearance: alert, cooperative and no distress GI: normal findings: soft, non-tender and JP drainage slight bile tinged Incision/Wound: Clean and dry  Lab Results:  Recent Labs    03/26/17 0450 03/27/17 0419  WBC 18.8* 16.1*  HGB 10.0* 9.0*  HCT 32.4* 28.2*  PLT 376 401*   BMET Recent Labs    03/26/17 0450 03/27/17 0419  NA 141 134*  K 3.5 3.3*  CL 103 101  CO2 30 26  GLUCOSE 99 103*  BUN 8 8  CREATININE 0.74 0.83  CALCIUM 8.5* 7.7*     Studies/Results: Ct Abdomen Pelvis W Contrast  Result Date: 03/26/2017 CLINICAL DATA:  72 year old female with abdominal pain and elevated white count. Postop day 6 following cholecystectomy for acute gangrenous cholecystitis with perforation. EXAM: CT ABDOMEN AND PELVIS WITH CONTRAST TECHNIQUE: Multidetector CT imaging of the abdomen and pelvis was performed using the standard protocol following bolus administration of intravenous contrast. CONTRAST:  100 cc intravenous Isovue-300 COMPARISON:  03/20/2017 CT FINDINGS: Lower chest: A small right pleural effusion and mild to moderate right basilar atelectasis noted. Mild left basilar atelectasis is present. Hepatobiliary: Patient is status  post cholecystectomy. A 2 x 2 x 4.5 cm area of low-attenuation along the anterior lower right liver (2:35-41) may represent postoperative changes/postoperative collection or abscess. No other new hepatic abnormalities are identified. A percutaneous drain in the right abdomen is noted. Pancreas: Unremarkable Spleen: Unremarkable Adrenals/Urinary Tract: The kidneys, adrenal glands and bladder are unremarkable except for a stable right renal cyst. Stomach/Bowel: A moderate to large hiatal hernia with organo-axial rotation is again noted. There is no evidence of bowel obstruction, bowel wall thickening or bowel inflammatory changes. Vascular/Lymphatic: No significant vascular findings are present. No enlarged abdominal or pelvic lymph nodes. Reproductive: Uterus and bilateral adnexa are unremarkable. Other: There is no evidence of pneumoperitoneum. A trace amount of free pelvic fluid is noted. Musculoskeletal: No acute abnormalities. IMPRESSION: 1. Status post cholecystectomy. 2 x 2 x 4.5 cm area of low-attenuation along the anterior lower right liver may represent postoperative hepatic change, postoperative collection or abscess. 2. Small right pleural effusion, moderate right lower lung and mild left lower lung atelectasis. 3. Unchanged moderate to large hiatal hernia. 4. Electronically Signed   By: Harmon Pier M.D.   On: 03/26/2017 10:43    Anti-infectives: Anti-infectives (From admission, onward)   Start     Dose/Rate Route Frequency Ordered Stop   03/20/17 1400  metroNIDAZOLE (FLAGYL) IVPB 500 mg     500 mg 100 mL/hr over 60 Minutes Intravenous Every 8 hours 03/20/17 0331 03/27/17 0649   03/20/17 0600  clindamycin (CLEOCIN) IVPB 900 mg     900 mg 100 mL/hr over 30 Minutes Intravenous On call  to O.R. 03/20/17 0312 03/20/17 1100   03/20/17 0600  gentamicin (GARAMYCIN) 390 mg in dextrose 5 % 100 mL IVPB     5 mg/kg  77.1 kg 109.8 mL/hr over 60 Minutes Intravenous On call to O.R. 03/20/17 0312 03/20/17  1027   03/20/17 0400  ciprofloxacin (CIPRO) IVPB 400 mg     400 mg 200 mL/hr over 60 Minutes Intravenous 2 times daily 03/20/17 0331 03/26/17 2326   03/20/17 0115  cefTRIAXone (ROCEPHIN) 1 g in dextrose 5 % 50 mL IVPB     1 g 100 mL/hr over 30 Minutes Intravenous  Once 03/20/17 0110 03/20/17 0441   03/20/17 0115  metroNIDAZOLE (FLAGYL) IVPB 500 mg  Status:  Discontinued     500 mg 100 mL/hr over 60 Minutes Intravenous  Once 03/20/17 0110 03/21/17 0809      Assessment/Plan: s/p Procedure(s): LAPAROSCOPIC CHOLECYSTECTOMY Severe cholecystitis.  Bile leak from liver bed suture during surgery. Postoperative bilious appearing drainage in JP.  HIDA negative. Leukocytosis.  CT showed approximately 3 cm abscess versus hematoma anterior to liver.  IR felt could not be drained.  WBC down some today.  Continue IV antibiotics and observation for now but overall appears to be improving.   LOS: 7 days    Mariella SaaBenjamin T Gregroy Dombkowski 03/27/2017

## 2017-03-28 NOTE — Progress Notes (Signed)
Patient ID: Leslie Patton, female   DOB: May 22, 1945, 72 y.o.   MRN: [id number] 8 Days Post-Op   Subjective: Continues to feel better daily.  Denies pain or nausea this morning.  Tolerating regular diet.  Objective: Vital signs in last 24 hours: Temp:  [98 F (36.7 C)-100.2 F (37.9 C)] 98.1 F (36.7 C) (01/27 0550) Pulse Rate:  [91-98] 91 (01/27 0550) Resp:  [16-18] 16 (01/27 0550) BP: (116-138)/(75-89) 138/75 (01/27 0550) SpO2:  [95 %-100 %] 95 % (01/27 0550) Last BM Date: 03/26/17  Intake/Output from previous day: 01/26 0701 - 01/27 0700 In: 1320 [P.O.:840; I.V.:480] Out: 1613 [Urine:1302; Drains:110; Stool:201] Intake/Output this shift: Total I/O In: 24 [P.O.:24] Out: 980 [Urine:900; Drains:80]  General appearance: alert, cooperative and no distress GI: normal findings: soft, non-tender Incision/Wound: Clean and dry.  JP drainage decreased amount, 110 cc yesterday and now only slightly bile tinged  Lab Results:  Recent Labs    03/26/17 0450 03/27/17 0419  WBC 18.8* 16.1*  HGB 10.0* 9.0*  HCT 32.4* 28.2*  PLT 376 401*   BMET Recent Labs    03/26/17 0450 03/27/17 0419  NA 141 134*  K 3.5 3.3*  CL 103 101  CO2 30 26  GLUCOSE 99 103*  BUN 8 8  CREATININE 0.74 0.83  CALCIUM 8.5* 7.7*     Studies/Results: Ct Abdomen Pelvis W Contrast  Result Date: 03/26/2017 CLINICAL DATA:  72 year old female with abdominal pain and elevated white count. Postop day 6 following cholecystectomy for acute gangrenous cholecystitis with perforation. EXAM: CT ABDOMEN AND PELVIS WITH CONTRAST TECHNIQUE: Multidetector CT imaging of the abdomen and pelvis was performed using the standard protocol following bolus administration of intravenous contrast. CONTRAST:  100 cc intravenous Isovue-300 COMPARISON:  03/20/2017 CT FINDINGS: Lower chest: A small right pleural effusion and mild to moderate right basilar atelectasis noted. Mild left basilar atelectasis is present. Hepatobiliary:  Patient is status post cholecystectomy. A 2 x 2 x 4.5 cm area of low-attenuation along the anterior lower right liver (2:35-41) may represent postoperative changes/postoperative collection or abscess. No other new hepatic abnormalities are identified. A percutaneous drain in the right abdomen is noted. Pancreas: Unremarkable Spleen: Unremarkable Adrenals/Urinary Tract: The kidneys, adrenal glands and bladder are unremarkable except for a stable right renal cyst. Stomach/Bowel: A moderate to large hiatal hernia with organo-axial rotation is again noted. There is no evidence of bowel obstruction, bowel wall thickening or bowel inflammatory changes. Vascular/Lymphatic: No significant vascular findings are present. No enlarged abdominal or pelvic lymph nodes. Reproductive: Uterus and bilateral adnexa are unremarkable. Other: There is no evidence of pneumoperitoneum. A trace amount of free pelvic fluid is noted. Musculoskeletal: No acute abnormalities. IMPRESSION: 1. Status post cholecystectomy. 2 x 2 x 4.5 cm area of low-attenuation along the anterior lower right liver may represent postoperative hepatic change, postoperative collection or abscess. 2. Small right pleural effusion, moderate right lower lung and mild left lower lung atelectasis. 3. Unchanged moderate to large hiatal hernia. 4. Electronically Signed   By: Harmon Pier M.D.   On: 03/26/2017 10:43    Anti-infectives: Anti-infectives (From admission, onward)   Start     Dose/Rate Route Frequency Ordered Stop   03/20/17 1400  metroNIDAZOLE (FLAGYL) IVPB 500 mg     500 mg 100 mL/hr over 60 Minutes Intravenous Every 8 hours 03/20/17 0331 03/27/17 0630   03/20/17 0600  clindamycin (CLEOCIN) IVPB 900 mg     900 mg 100 mL/hr over 30 Minutes Intravenous On call to O.R.  03/20/17 0312 03/20/17 1100   03/20/17 0600  gentamicin (GARAMYCIN) 390 mg in dextrose 5 % 100 mL IVPB     5 mg/kg  77.1 kg 109.8 mL/hr over 60 Minutes Intravenous On call to O.R.  03/20/17 0312 03/20/17 1027   03/20/17 0400  ciprofloxacin (CIPRO) IVPB 400 mg     400 mg 200 mL/hr over 60 Minutes Intravenous 2 times daily 03/20/17 0331 03/26/17 2326   03/20/17 0115  cefTRIAXone (ROCEPHIN) 1 g in dextrose 5 % 50 mL IVPB     1 g 100 mL/hr over 30 Minutes Intravenous  Once 03/20/17 0110 03/20/17 0441   03/20/17 0115  metroNIDAZOLE (FLAGYL) IVPB 500 mg  Status:  Discontinued     500 mg 100 mL/hr over 60 Minutes Intravenous  Once 03/20/17 0110 03/21/17 0809      Assessment/Plan: s/p Procedure(s): LAPAROSCOPIC CHOLECYSTECTOMY Difficult gallbladder with perforation and bile duct and liver bed was oversewn Postoperative leukocytosis which is beginning to improve.  CT scan showed hematoma or small abscess anterior to right lobe of liver which could not be drained Clinically much improved.  Repeat CBC tomorrow.  Could likely go home tomorrow on course of oral antibiotics if leukocytosis improving.    LOS: 8 days    Mariella SaaBenjamin T Deeandra Jerry 03/28/2017

## 2017-03-29 LAB — CBC
HCT: 31.1 % — ABNORMAL LOW (ref 36.0–46.0)
Hemoglobin: 9.7 g/dL — ABNORMAL LOW (ref 12.0–15.0)
MCH: 26.9 pg (ref 26.0–34.0)
MCHC: 31.2 g/dL (ref 30.0–36.0)
MCV: 86.4 fL (ref 78.0–100.0)
PLATELETS: 468 10*3/uL — AB (ref 150–400)
RBC: 3.6 MIL/uL — AB (ref 3.87–5.11)
RDW: 16.9 % — ABNORMAL HIGH (ref 11.5–15.5)
WBC: 14.9 10*3/uL — AB (ref 4.0–10.5)

## 2017-03-29 MED ORDER — CIPROFLOXACIN HCL 500 MG PO TABS: 500.0000 mg | ORAL_TABLET | Freq: Two times a day (BID) | ORAL | 0 refills | Status: DC

## 2017-03-29 MED ORDER — METRONIDAZOLE 500 MG PO TABS: 500.0000 mg | ORAL_TABLET | Freq: Two times a day (BID) | ORAL | 0 refills | Status: AC

## 2017-03-29 NOTE — Care Management Important Message (Signed)
Important Message  Patient Details  Name: Leslie Patton MRN: [id number] Date of Birth: 10/09/1945   Medicare Important Message Given:  Yes    Caren MacadamFuller, Wiatt Mahabir 03/29/2017, 11:07 AMImportant Message  Patient Details  Name: Leslie Patton MRN: [id number] Date of Birth: 07/24/1945   Medicare Important Message Given:  Yes    Caren MacadamFuller, Anette Barra 03/29/2017, 11:07 AM

## 2017-03-29 NOTE — Progress Notes (Signed)
Discharge instructions given. Pt verbalized understanding and all questions were answered.  

## 2017-03-29 NOTE — Discharge Summary (Signed)
[facility] Discharge Summary   Patient ID: Leslie Patton MRN: [id number] DOB/AGE: Jan 15, 1946 72 y.o.  Admit date: 03/19/2017 Discharge date: 03/29/2017  Admitting Diagnosis: Acute cholecystitis  Discharge Diagnosis Acute cholecystitis with perforated gallbladder - s/p cholecystectomy  Consultants Interventional Radiology  Imaging: No results found.  Procedures Dr. Michaell Cowing (03/20/17) - Laparoscopic Cholecystectomy, lysis of adhesions, hepatic repair  Hospital Course:  Patient is a 72 y/o female who presented to [facility] with RUQ pain.  Workup showed acute cholecystitis and patient was transferred to [facility] for [facility] evaluation.  Patient was admitted and underwent procedure listed above. Tolerated procedure well and was transferred to the floor. Diet was advanced as tolerated. Drain was noted to have some serobilious output, HIDA was ordered 1/22 to check for bile leak and was negative. CT abdomen done for leukocytosis 1/25 and showed small fluid collection anterior to the liver which was evaluated by IR and felt too small to drain. Leukocytosis improved on IV antibiotics and patient will be discharged on another 5 days PO antibiotics. PT/OT evaluated patient and recommended continue home health therapies. On POD#9, the patient was voiding well, tolerating diet, ambulating well, pain well controlled, vital signs stable, incisions c/d/i and felt stable for discharge home.  Patient will follow up in our office in 2 weeks and knows to call with questions or concerns. She will call to confirm appointment date/time.    Physical Exam: General:  Alert, NAD, pleasant, comfortable Abd:  Soft, ND, mild tenderness, incisions C/D/I, drain with minimal serous drainage   Allergies as of 03/29/2017      Reactions   Penicillins Hives      Medication List    TAKE these medications   acetaminophen 500 MG tablet Commonly known as:  TYLENOL Take 500 mg by mouth every 6 (six) hours as needed  for moderate pain.   ALPRAZolam 0.5 MG tablet Commonly known as:  XANAX Take 0.5 mg by mouth every morning.   calcium carbonate 1250 (500 Ca) MG tablet Commonly known as:  OS-CAL - dosed in mg of elemental calcium Take 2 tablets by mouth daily with breakfast.   CENTRUM SILVER tablet Take 1 tablet by mouth daily.   cholecalciferol 1000 units tablet Commonly known as:  VITAMIN D Take 2,000 Units by mouth daily.   ciprofloxacin 500 MG tablet Commonly known as:  CIPRO Take 1 tablet (500 mg total) by mouth 2 (two) times daily.   donepezil 5 MG tablet Commonly known as:  ARICEPT Take 5 mg by mouth at bedtime.   DULoxetine 60 MG capsule Commonly known as:  CYMBALTA Take 60 mg by mouth daily.   fluvoxaMINE 100 MG tablet Commonly known as:  LUVOX Take 100 mg by mouth at bedtime.   Folic Acid-Vit B6-Vit B12 2.5-25-1 MG Tabs tablet Commonly known as:  FOLBEE Take 2 tablets by mouth daily.   hydrochlorothiazide 25 MG tablet Commonly known as:  HYDRODIURIL Take 25 mg by mouth daily.   metroNIDAZOLE 500 MG tablet Commonly known as:  FLAGYL Take 1 tablet (500 mg total) by mouth 2 (two) times daily with a meal for 5 days. DO NOT CONSUME ALCOHOL WHILE TAKING THIS MEDICATION.   OLANZapine 2.5 MG tablet Commonly known as:  ZYPREXA Take 2.5 mg by mouth at bedtime.            Durable Medical Equipment  (From admission, onward)        Start     Ordered   03/24/17 0931  For home  use only DME 3 n 1  Once     03/24/17 0930   03/24/17 0930  For home use only DME Walker rolling  Once    Question:  Patient needs a walker to treat with the following condition  Answer:  Gangrenous cholecystitis   03/24/17 0930       Follow-up Information    Karie SodaGross, Steven, MD. Go on 04/12/2017.   Specialty:  General Surgery Why:  Your appointment is 04/12/2017 at 4:30PM. Please arrive 30 minutes prior to your appointment to check in and fill out paperwork. Bring photo ID and insurance  information. Contact information: [address] [place] [address] [phone]        [facility]-Home Follow up.   Specialty:  Home Health Services Why:  physical therapy Contact information: [address] [place] [address] [phone]        [facility] - Dme Follow up.   Why:  walker and 3n[address] Contact information: [address] [place] [address] [phone]           Signed: Wells GuilesKelly Rayburn, [facility] 03/29/2017, 9:55 AM Pager: [phone] Consults: [phone] Mon-Fri 7:00 am-4:30 pm Sat-Sun 7:00 am-11:30 am

## 2017-03-29 NOTE — Progress Notes (Signed)
Physical Therapy Treatment Patient Details Name: Leslie Patton MRN: [id number] DOB: 04/03/1945 Today's Date: 03/29/2017    History of Present Illness Patient is a 72 y/o female admitted with acute cholecystitis now s/p lap chole 03/20/17.  PMH positive for OSA, OCD, anxiety, HTN and gait difficutly.     PT Comments    Pt in good spirits and plans to dc home this date.  Pt ambulating increased distance in halls with min instability, no assistive device required and no LOB.   Follow Up Recommendations  Supervision/Assistance - 24 hour;No PT follow up     Equipment Recommendations  None recommended by PT    Recommendations for Other Services       Precautions / Restrictions Precautions Precautions: Fall Restrictions Weight Bearing Restrictions: No    Mobility  Bed Mobility                  Transfers Overall transfer level: Modified independent Equipment used: None Transfers: Sit to/from Stand Sit to Stand: Modified independent (Device/Increase time)            Ambulation/Gait Ambulation/Gait assistance: Supervision Ambulation Distance (Feet): 450 Feet Assistive device: None Gait Pattern/deviations: Step-through pattern;Decreased stride length;Trunk flexed;Wide base of support Gait velocity: decr Gait velocity interpretation: Below normal speed for age/gender General Gait Details: pt moving cautiously and with mild instability but no LOB   Stairs            Wheelchair Mobility    Modified Rankin (Stroke Patients Only)       Balance Overall balance assessment: Needs assistance Sitting-balance support: No upper extremity supported;Feet supported Sitting balance-Leahy Scale: Good     Standing balance support: No upper extremity supported Standing balance-Leahy Scale: Fair                              Cognition Arousal/Alertness: Awake/alert Behavior During Therapy: WFL for tasks assessed/performed Overall Cognitive Status:  Within Functional Limits for tasks assessed                                        Exercises      General Comments        Pertinent Vitals/Pain Pain Assessment: No/denies pain    Home Living                      Prior Function            PT Goals (current goals can now be found in the care plan section) Acute Rehab PT Goals Patient Stated Goal: To return home PT Goal Formulation: With patient Time For Goal Achievement: 03/29/17 Potential to Achieve Goals: Good Progress towards PT goals: Progressing toward goals    Frequency    Min 3X/week      PT Plan Discharge plan needs to be updated    Co-evaluation              AM-PAC PT "6 Clicks" Daily Activity  Outcome Measure  Difficulty turning over in bed (including adjusting bedclothes, sheets and blankets)?: A Little Difficulty moving from lying on back to sitting on the side of the bed? : A Little Difficulty sitting down on and standing up from a chair with arms (e.g., wheelchair, bedside commode, etc,.)?: A Little Help needed moving to and from a bed to chair (including a wheelchair)?:  A Little Help needed walking in hospital room?: None Help needed climbing 3-5 steps with a railing? : A Little 6 Click Score: 19    End of Session Equipment Utilized During Treatment: Gait belt Activity Tolerance: Patient tolerated treatment well Patient left: in chair;with call bell/phone within reach Nurse Communication: Mobility status PT Visit Diagnosis: Other abnormalities of gait and mobility (R26.89)     Time: 6962-9528 PT Time Calculation (min) (ACUTE ONLY): 13 min  Charges:  $Gait Training: 8-22 mins                    G Codes:       Pg [phone]    Leslie Patton 03/29/2017, 10:39 AM

## 2017-04-29 IMAGING — CT CT CHEST W/O CM
2 of 3 series · 15 of 36 positions shown, 18 images · non-contrast
Comparison: None.

CLINICAL DATA: F/u lung nodule seen 5 years ago report from [facility] note
scanned Ex smoker quit 3059 No surg No hx ca.

EXAM:
CT CHEST WITHOUT CONTRAST
TECHNIQUE: Multidetector CT imaging of the chest was performed following the
standard protocol without IV contrast.

[Series 3: chest w/(date) · axial · 0.74mm/px · z∈[-314,-39]mm · 12 of 65 slices shown, 15 images]
[im 5/65  mediastinal]
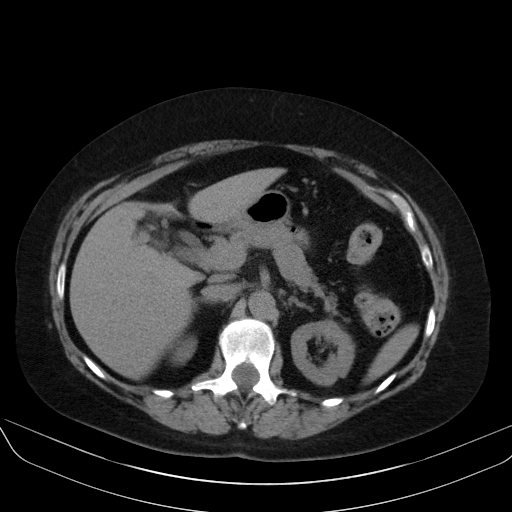
[im 5/65  lung]
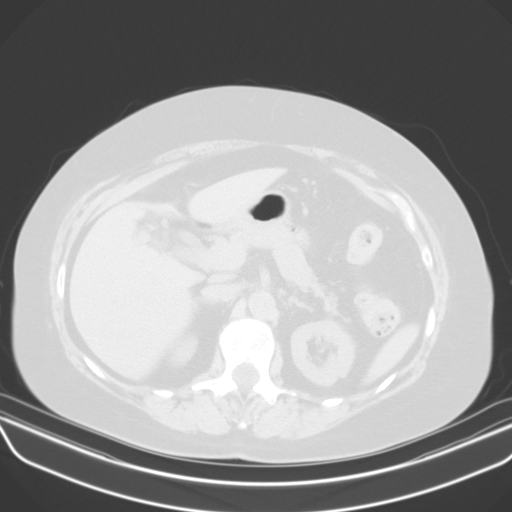
[im 10/65  lung]
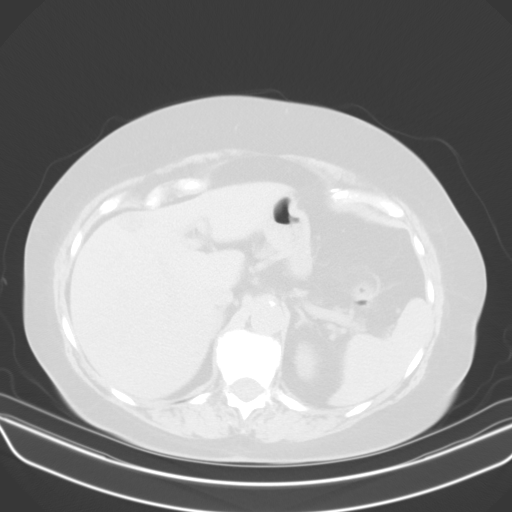
[im 15/65  lung]
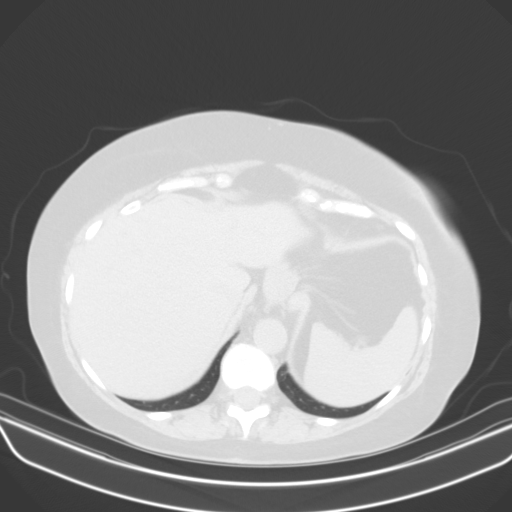
[im 19/65  lung]
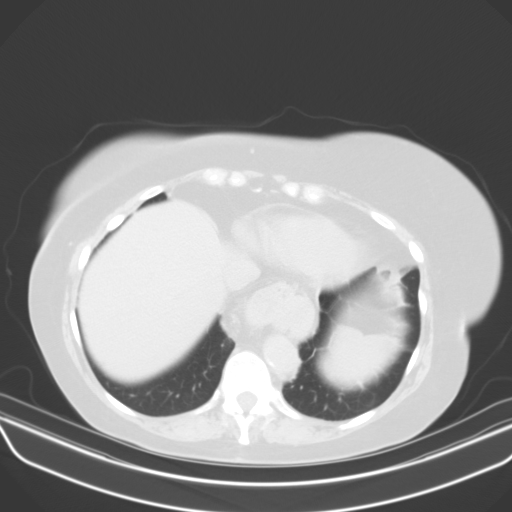
[im 24/65  mediastinal]
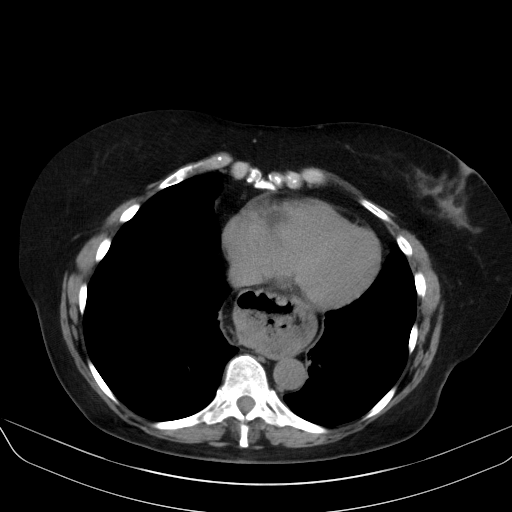
[im 24/65  lung]
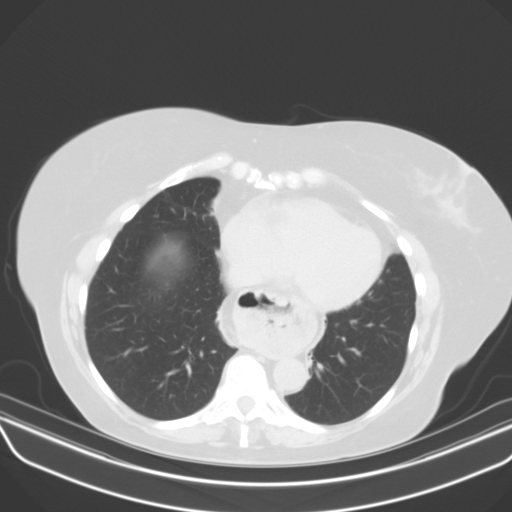
[im 29/65  lung]
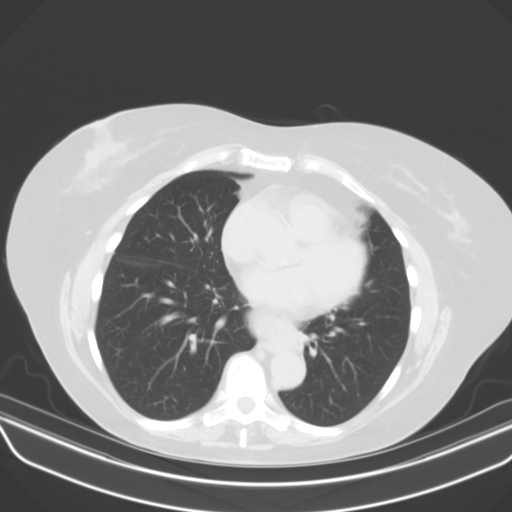
[im 36/65  lung]
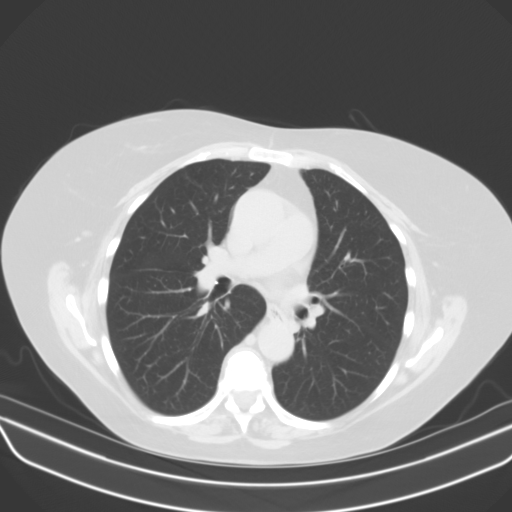
[im 41/65  lung]
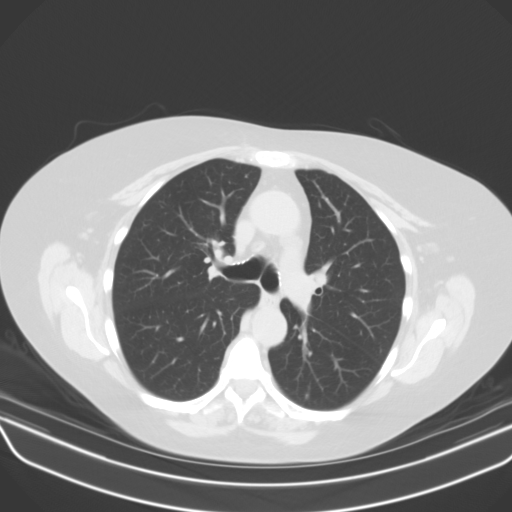
[im 46/65  mediastinal]
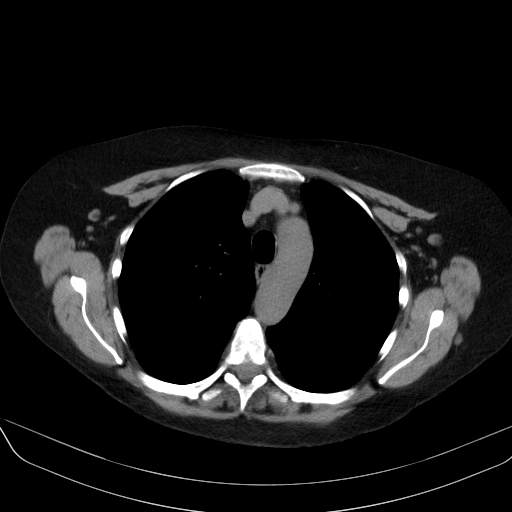
[im 46/65  lung]
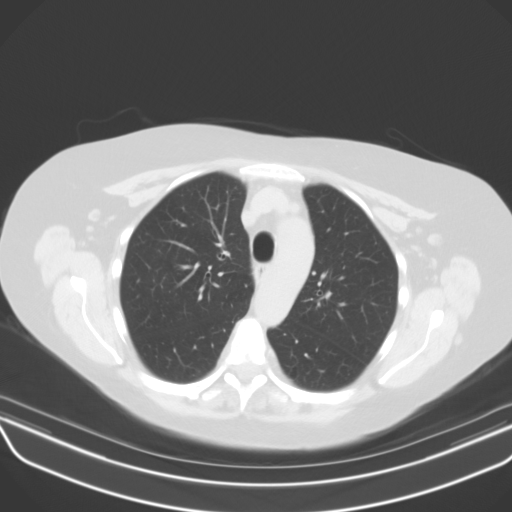
[im 50/65  lung]
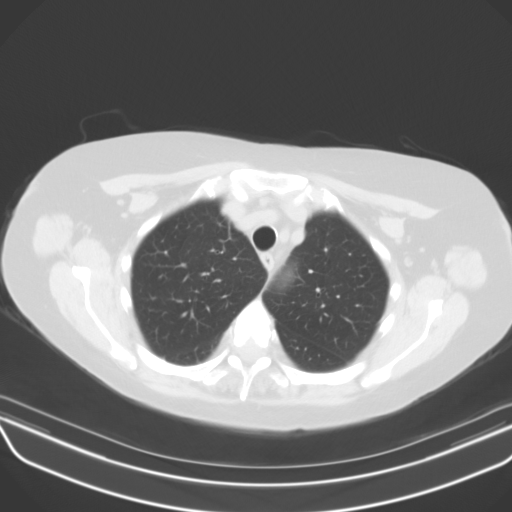
[im 55/65  lung]
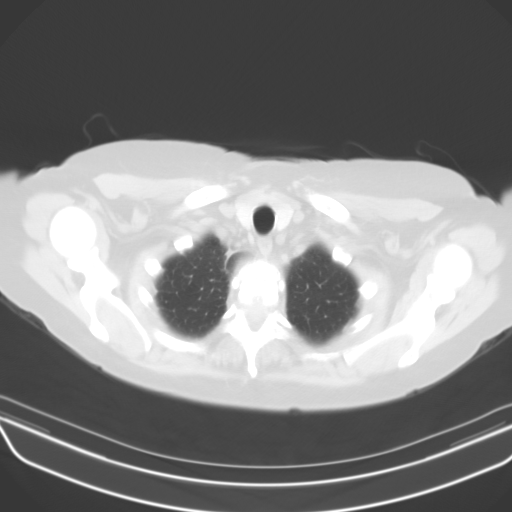
[im 60/65  lung]
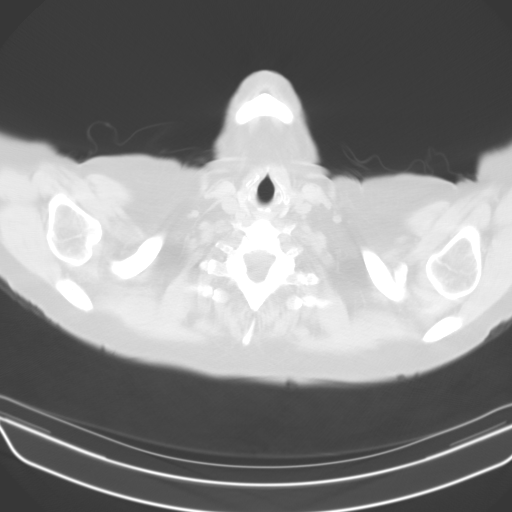

[Series 4: cor · coronal · 0.63mm/px · 3 of 80 slices shown]
[im 16/80  lung]
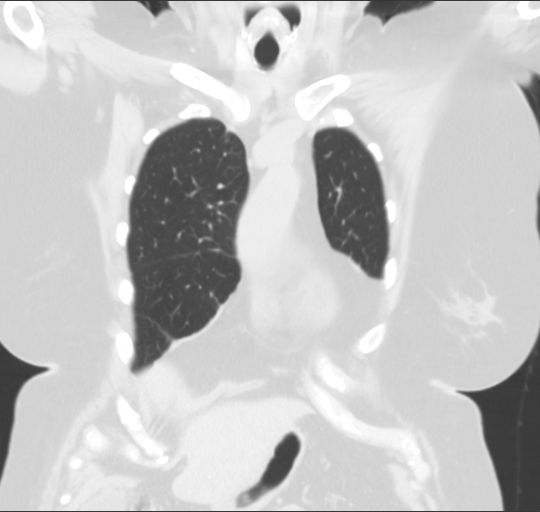
[im 32/80  lung]
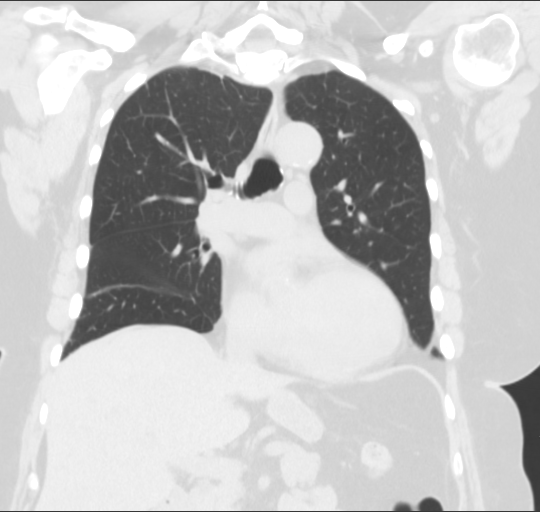
[im 48/80  lung]
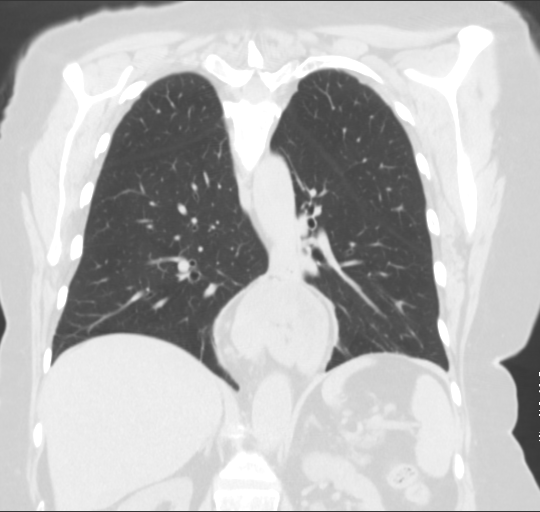

[15 of 36 positions shown; findings below may reference images not displayed]

FINDINGS: Heart: Heart size is normal. No imaged pericardial effusion or
significant coronary artery calcifications.

Vascular structures: Tortuous but otherwise normal appearance of the
thoracic aorta.

Mediastinum/thyroid: The visualized portion of the thyroid gland has
a normal appearance. Sub cm mediastinal lymph nodes. Large hiatal
hernia.

Lungs/Airways: No pulmonary nodules, pleural effusions, or
infiltrates.

Upper abdomen: Unremarkable.  The

Chest wall/osseous structures: Mild mid thoracic spondylosis. No
suspicious lytic or blastic lesions are identified.
IMPRESSION: 1. No pulmonary nodules.
2. Large hiatal hernia.

## 2017-11-19 ENCOUNTER — Other Ambulatory Visit: Payer: Self-pay | Admitting: Family Medicine

## 2017-11-19 DIAGNOSIS — Z1231 Encounter for screening mammogram for malignant neoplasm of breast: Secondary | ICD-10-CM

## 2017-11-22 ENCOUNTER — Ambulatory Visit
Admission: RE | Admit: 2017-11-22 | Discharge: 2017-11-22 | Disposition: A | Discharge status: Home / Self Care | Payer: Medicare Other | Source: Ambulatory Visit | Attending: Family Medicine | Admitting: Family Medicine

## 2017-11-22 DIAGNOSIS — Z1231 Encounter for screening mammogram for malignant neoplasm of breast: Secondary | ICD-10-CM

## 2020-04-26 ENCOUNTER — Other Ambulatory Visit: Payer: Self-pay | Admitting: Family Medicine

## 2020-04-26 DIAGNOSIS — Z1231 Encounter for screening mammogram for malignant neoplasm of breast: Secondary | ICD-10-CM

## 2020-06-18 ENCOUNTER — Ambulatory Visit
Admission: RE | Admit: 2020-06-18 | Discharge: 2020-06-18 | Disposition: A | Discharge status: Home / Self Care | Payer: Medicare Other | Source: Ambulatory Visit | Attending: Family Medicine | Admitting: Family Medicine

## 2020-06-18 ENCOUNTER — Other Ambulatory Visit: Payer: Self-pay

## 2020-06-18 DIAGNOSIS — Z1231 Encounter for screening mammogram for malignant neoplasm of breast: Secondary | ICD-10-CM

## 2021-04-04 ENCOUNTER — Other Ambulatory Visit: Payer: Self-pay | Admitting: Family Medicine

## 2021-04-04 DIAGNOSIS — E2839 Other primary ovarian failure: Secondary | ICD-10-CM

## 2022-10-09 ENCOUNTER — Ambulatory Visit (HOSPITAL_COMMUNITY)
Admission: EM | Admit: 2022-10-09 | Discharge: 2022-10-09 | Disposition: A | Discharge status: Home / Self Care | Payer: Medicare Other | Attending: Emergency Medicine | Admitting: Emergency Medicine

## 2022-10-09 ENCOUNTER — Encounter (HOSPITAL_COMMUNITY): Payer: Self-pay | Admitting: Emergency Medicine

## 2022-10-09 DIAGNOSIS — Z23 Encounter for immunization: Secondary | ICD-10-CM

## 2022-10-09 DIAGNOSIS — W5501XA Bitten by cat, initial encounter: Secondary | ICD-10-CM

## 2022-10-09 DIAGNOSIS — Z203 Contact with and (suspected) exposure to rabies: Secondary | ICD-10-CM | POA: Diagnosis not present

## 2022-10-09 MED ORDER — RABIES IMMUNE GLOBULIN 150 UNIT/ML IM INJ
20.0000 [IU]/kg | INJECTION | Freq: Once | INTRAMUSCULAR | Status: AC
  Administered 2022-10-09: 1275 [IU] via INTRAMUSCULAR

## 2022-10-09 MED ORDER — RABIES VACCINE, PCEC IM SUSR
INTRAMUSCULAR | Status: AC
  Filled 2022-10-09: qty 1

## 2022-10-09 MED ORDER — RABIES VACCINE, PCEC IM SUSR
1.0000 mL | Freq: Once | INTRAMUSCULAR | Status: AC
  Administered 2022-10-09: 1 mL via INTRAMUSCULAR

## 2022-10-09 MED ORDER — RABIES IMMUNE GLOBULIN 150 UNIT/ML IM INJ
INJECTION | INTRAMUSCULAR | Status: AC
  Filled 2022-10-09: qty 10

## 2022-10-09 NOTE — Discharge Instructions (Addendum)
The cat bite that you received does not appear infected.  Please return to clinic or follow-up if you develop redness, pain, streaking, fever or any signs concerning of infection.  We have started you with the rabies immunoglobulin as well as the rabies vaccine, please return as scheduled to this urgent care or the one on [address] to complete the vaccine series.  In addition to the human rabies immune globulin (HRIG), you were given the first dose of the rabies vaccine today (Day 0).  Please return here on the following dates to complete the rabies series: Day 3: 10/12/2022 Day 7: 10/16/2022 Day 14: 10/23/2022

## 2022-10-09 NOTE — ED Triage Notes (Addendum)
Pt reports that on Wed her cat that she has had for 13 years bit her on right index finger when went to rub them. Pt reports that cat mainly indoor cat that goes out to use restroom. Reports cat has disappeared after bit patient. Pt reports that animal control recommended being seen for possible rabies vaccinations.  Pt has not markings or obvious injuries to finger from bit. Denies any complications

## 2022-10-09 NOTE — ED Provider Notes (Signed)
[facility]    CSN: [id number] Arrival date & time: 10/09/22  1736      History   Chief Complaint Chief Complaint  Patient presents with   Animal Bite    HPI Leslie Patton is a 77 y.o. female.   Patient presents to clinic over concerns for rabies.  Her cat bit her on her right index finger.  The bite site has since healed.  Reports initially it bled, she cleaned it and the bleeding resolved.   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  Reports he is not up-to-date on his rabies vaccine.  He is mostly indoor but does get outdoors to use the bathroom.  Reports he was acting strange prior to the bite, not eating and acting unusual.  Denies any drooling.  He has never bit her before.  The cat has since disappeared, so she is unable to quarantine him.    The history is provided by the patient and medical records.  Animal Bite Associated symptoms: no fever     Past Medical History:  Diagnosis Date   Anxiety    Hypertension    OCD (obsessive compulsive disorder)    Vitamin D deficiency     Patient Active Problem List   Diagnosis Date Noted   Acute on chronic cholecystitis s/p laparoscopic cholecystectomy 03/20/2017 03/20/2017   Anxiety 03/20/2017   Hypokalemia 03/20/2017   Perforated gallbladder s/p laparoscopic cholecystectomy 03/20/2017 03/20/2017   Peritonitis due to bile from perforated gallbladder 03/20/2017   Status post laparoscopic cholecystectomy 03/20/2017   OCD (obsessive compulsive disorder)    Hypertension    Risk for falls 09/11/2015   Nocturnal hypoxia 05/30/2014   OSA (obstructive sleep apnea) 03/21/2014   Gait difficulty 03/21/2014   Peripheral polyneuropathy 03/21/2014   Cognitive changes 03/21/2014    Past Surgical History:  Procedure Laterality Date   LAPAROSCOPIC CHOLECYSTECTOMY SINGLE PORT N/A 03/20/2017   Procedure: LAPAROSCOPIC CHOLECYSTECTOMY;  Surgeon: Karie Soda, MD;  Location: [facility];  Service:  General;  Laterality: N/A;   TUBAL LIGATION      OB History   No obstetric history on file.      Home Medications    Prior to Admission medications   Medication Sig Start Date End Date Taking? Authorizing Provider  acetaminophen (TYLENOL) 500 MG tablet Take 500 mg by mouth every 6 (six) hours as needed for moderate pain.    [provider]  ALPRAZolam Prudy Feeler) 0.5 MG tablet Take 0.5 mg by mouth every morning.    [provider]  calcium carbonate (OS-CAL - DOSED IN MG OF ELEMENTAL CALCIUM) 1250 (500 Ca) MG tablet Take 2 tablets by mouth daily with breakfast.    [provider]  cholecalciferol (VITAMIN D) 1000 UNITS tablet Take 2,000 Units by mouth daily.     [provider]  ciprofloxacin (CIPRO) 500 MG tablet Take 1 tablet (500 mg total) by mouth 2 (two) times daily. 03/29/17   Juliet Rude, PA-C  donepezil (ARICEPT) 5 MG tablet Take 5 mg by mouth at bedtime.    [provider]  DULoxetine (CYMBALTA) 60 MG capsule Take 60 mg by mouth daily.    [provider]  fluvoxaMINE (LUVOX) 100 MG tablet Take 100 mg by mouth at bedtime.    [provider]  Folic Acid-Vit B6-Vit B12 (FOLBEE) 2.5-25-1 MG TABS tablet Take 2 tablets by mouth daily.    [provider]  hydrochlorothiazide (HYDRODIURIL) 25 MG  tablet Take 25 mg by mouth daily.    [provider]  Multiple Vitamins-Minerals (CENTRUM SILVER) tablet Take 1 tablet by mouth daily.    [provider]  OLANZapine (ZYPREXA) 2.5 MG tablet Take 2.5 mg by mouth at bedtime.    [provider]    Family History No family history on file.  Social History Social History   Tobacco Use   Smoking status: Former   Smokeless tobacco: Never  Substance Use Topics   Alcohol use: Yes   Drug use: No     Allergies   Penicillins   Review of Systems Review of Systems  Constitutional:  Negative for fever.  Skin:  Positive for wound.      Physical Exam Triage Vital Signs ED Triage Vitals  Encounter Vitals Group     BP 10/09/22 1841 (!) 144/92     Systolic BP Percentile --      Diastolic BP Percentile --      Pulse Rate 10/09/22 1841 90     Resp 10/09/22 1841 17     Temp 10/09/22 1841 98.3 F (36.8 C)     Temp Source 10/09/22 1841 Oral     SpO2 10/09/22 1841 96 %     Weight --      Height --      Head Circumference --      Peak Flow --      Pain Score 10/09/22 1842 0     Pain Loc --      Pain Education --      Exclude from Growth Chart --    No data found.  Updated Vital Signs BP (!) 144/92 (BP Location: Left Arm)   Pulse 90   Temp 98.3 F (36.8 C) (Oral)   Resp 17   SpO2 96%   Visual Acuity Right Eye Distance:   Left Eye Distance:   Bilateral Distance:    Right Eye Near:   Left Eye Near:    Bilateral Near:     Physical Exam Vitals and nursing note reviewed.  Constitutional:      Appearance: Normal appearance.  HENT:     Head: Normocephalic and atraumatic.     Right Ear: External ear normal.     Left Ear: External ear normal.     Nose: Nose normal.     Mouth/Throat:     Mouth: Mucous membranes are moist.  Eyes:     Conjunctiva/sclera: Conjunctivae normal.  Cardiovascular:     Rate and Rhythm: Normal rate.     Pulses: Normal pulses.  Pulmonary:     Effort: Pulmonary effort is normal. No respiratory distress.  Musculoskeletal:        General: Normal range of motion.  Skin:    General: Skin is warm and dry.     Capillary Refill: Capillary refill takes less than 2 seconds.  Neurological:     General: No focal deficit present.     Mental Status: She is alert and oriented to person, place, and time.  Psychiatric:        Mood and Affect: Mood normal.        Behavior: Behavior normal.      UC Treatments / Results  Labs (all labs ordered are listed, but only abnormal results are displayed) Labs Reviewed - No data to display  EKG   Radiology No results  found.  Procedures Procedures (including critical care time)  Medications Ordered in UC Medications  rabies immune globulin (HYPERRAB/KEDRAB)  injection 20 Units/kg (has no administration in time range)  rabies vaccine (RABAVERT) injection 1 mL (has no administration in time range)    Initial Impression / Assessment and Plan / UC Course  I have reviewed the triage vital signs and the nursing notes.  Pertinent labs & imaging results that were available during my care of the patient were reviewed by me and considered in my medical decision making (see chart for details).  Vitals and triage reviewed, patient is hemodynamically stable.  Discussed that since cat had change in behavior and is no longer available for quarantine, that we will proceed with the rabies vaccine series in clinic.  Bite site is healed and does not appear infected. She had her Tdap updated earlier today.  Given return schedule for series.  Plan of care, follow-up care and return precautions given, no questions at this time.     Final Clinical Impressions(s) / UC Diagnoses   Final diagnoses:  Cat bite, initial encounter  Need for post exposure prophylaxis for rabies     Discharge Instructions      The cat bite that you received does not appear infected.  Please return to clinic or follow-up if you develop redness, pain, streaking, fever or any signs concerning of infection.  We have started you with the rabies immunoglobulin as well as the rabies vaccine, please return as scheduled to this urgent care or the one on [address] to complete the vaccine series.  In addition to the human rabies immune globulin (HRIG), you were given the first dose of the rabies vaccine today (Day 0).  Please return here on the following dates to complete the rabies series: Day 3: 10/12/2022 Day 7: 10/16/2022 Day 14: 10/23/2022       ED Prescriptions   None    PDMP not reviewed this encounter.   Kimothy Kishimoto, [place],  [place] 10/09/22 [phone]

## 2022-10-13 ENCOUNTER — Ambulatory Visit
Admission: RE | Admit: 2022-10-13 | Discharge: 2022-10-13 | Disposition: A | Discharge status: Home / Self Care | Payer: Medicare Other | Source: Ambulatory Visit | Attending: Internal Medicine | Admitting: Internal Medicine

## 2022-10-13 VITALS — BP 120/80 | HR 104 | Temp 98.3°F | Resp 18

## 2022-10-13 DIAGNOSIS — L03114 Cellulitis of left upper limb: Secondary | ICD-10-CM

## 2022-10-13 DIAGNOSIS — Z203 Contact with and (suspected) exposure to rabies: Secondary | ICD-10-CM

## 2022-10-13 DIAGNOSIS — S51859A Open bite of unspecified forearm, initial encounter: Secondary | ICD-10-CM | POA: Diagnosis not present

## 2022-10-13 DIAGNOSIS — W5501XA Bitten by cat, initial encounter: Secondary | ICD-10-CM | POA: Diagnosis not present

## 2022-10-13 DIAGNOSIS — Z23 Encounter for immunization: Secondary | ICD-10-CM

## 2022-10-13 MED ORDER — CEFUROXIME AXETIL 500 MG PO TABS: 500.0000 mg | ORAL_TABLET | Freq: Two times a day (BID) | ORAL | 0 refills | Status: DC

## 2022-10-13 MED ORDER — METRONIDAZOLE 500 MG PO TABS: 500.0000 mg | ORAL_TABLET | Freq: Three times a day (TID) | ORAL | 0 refills | Status: DC

## 2022-10-13 MED ORDER — RABIES VACCINE, PCEC IM SUSR
1.0000 mL | Freq: Once | INTRAMUSCULAR | Status: AC
  Administered 2022-10-13: 1 mL via INTRAMUSCULAR

## 2022-10-13 NOTE — ED Provider Notes (Signed)
[facility]  Note:  This document was prepared using Conservation officer, historic buildings and may include unintentional dictation errors.  MRN: [id number] DOB: [birth year]  Subjective:   Leslie Patton is a 77 y.o. female presenting for left forearm pain, redness and swelling.  Patient was bit by her cat last night.  Has not noted fever, drainage of pus or bleeding.  She is actually here as well for rabies vaccination as she suffered a bite 10/09/2022.   Current Facility-Administered Medications:    rabies vaccine (RABAVERT) injection 1 mL, 1 mL, Intramuscular, Once, Wallis Bamberg, PA-C  Current Outpatient Medications:    acetaminophen (TYLENOL) 500 MG tablet, Take 500 mg by mouth every 6 (six) hours as needed for moderate pain., Disp: , Rfl:    ALPRAZolam (XANAX) 0.5 MG tablet, Take 0.5 mg by mouth every morning., Disp: , Rfl:    calcium carbonate (OS-CAL - DOSED IN MG OF ELEMENTAL CALCIUM) 1250 (500 Ca) MG tablet, Take 2 tablets by mouth daily with breakfast., Disp: , Rfl:    cholecalciferol (VITAMIN D) 1000 UNITS tablet, Take 2,000 Units by mouth daily. , Disp: , Rfl:    ciprofloxacin (CIPRO) 500 MG tablet, Take 1 tablet (500 mg total) by mouth 2 (two) times daily., Disp: 10 tablet, Rfl: 0   donepezil (ARICEPT) 5 MG tablet, Take 5 mg by mouth at bedtime., Disp: , Rfl:    DULoxetine (CYMBALTA) 60 MG capsule, Take 60 mg by mouth daily., Disp: , Rfl:    fluvoxaMINE (LUVOX) 100 MG tablet, Take 100 mg by mouth at bedtime., Disp: , Rfl:    Folic Acid-Vit B6-Vit B12 (FOLBEE) 2.5-25-1 MG TABS tablet, Take 2 tablets by mouth daily., Disp: , Rfl:    hydrochlorothiazide (HYDRODIURIL) 25 MG tablet, Take 25 mg by mouth daily., Disp: , Rfl:    losartan (COZAAR) 25 MG tablet, Take 25 mg by mouth daily., Disp: , Rfl:    Multiple Vitamins-Minerals (CENTRUM SILVER) tablet, Take 1 tablet by mouth daily., Disp: , Rfl:    OLANZapine (ZYPREXA) 2.5 MG tablet, Take 2.5 mg by mouth at  bedtime., Disp: , Rfl:    Allergies  Allergen Reactions   Penicillins Hives    Past Medical History:  Diagnosis Date   Anxiety    Hypertension    OCD (obsessive compulsive disorder)    Vitamin D deficiency      Past Surgical History:  Procedure Laterality Date   LAPAROSCOPIC CHOLECYSTECTOMY SINGLE PORT N/A 03/20/2017   Procedure: LAPAROSCOPIC CHOLECYSTECTOMY;  Surgeon: Karie Soda, MD;  Location: [facility];  Service: General;  Laterality: N/A;   TUBAL LIGATION      History reviewed. No pertinent family history.  Social History   Tobacco Use   Smoking status: Former   Smokeless tobacco: Never  Substance Use Topics   Alcohol use: Yes   Drug use: No    ROS   Objective:   Vitals: BP 120/80 (BP Location: Left Arm)   Pulse (!) 104   Temp 98.3 F (36.8 C) (Oral)   Resp 18   SpO2 95%   Physical Exam Constitutional:      General: She is not in acute distress.    Appearance: Normal appearance. She is well-developed. She is not ill-appearing, toxic-appearing or diaphoretic.  HENT:     Head: Normocephalic and atraumatic.     Nose: Nose normal.     Mouth/Throat:     Mouth: Mucous membranes are moist.  Eyes:  General: No scleral icterus.       Right eye: No discharge.        Left eye: No discharge.     Extraocular Movements: Extraocular movements intact.  Cardiovascular:     Rate and Rhythm: Normal rate.  Pulmonary:     Effort: Pulmonary effort is normal.  Skin:    General: Skin is warm and dry.       Neurological:     General: No focal deficit present.     Mental Status: She is alert and oriented to person, place, and time.  Psychiatric:        Mood and Affect: Mood normal.        Behavior: Behavior normal.        Thought Content: Thought content normal.        Judgment: Judgment normal.       RabAvert 1 mL administered IM in clinic today.  Assessment and Plan :   PDMP not reviewed this encounter.  1. Cellulitis of left forearm   2. Cat  bite of forearm, initial encounter   3. Need for immunization against rabies    Will use cefuroxime as an alternative to amoxicillin given her medication allergies.  Also recommend Flagyl for anaerobe coverage.  This is appropriate coverage against animal bites.  Discussed wound care.  Maintain strict ER precautions.  Finish out rabies vaccination series.   Wallis Bamberg, [place] 10/13/22 [id number]

## 2022-10-13 NOTE — ED Triage Notes (Signed)
Pt presents reports warm feeling, swelling and redness in the left wrist since last night after she was bit by her cat.   Pt presents for Rabies vaccine  # 2.

## 2022-10-16 ENCOUNTER — Ambulatory Visit
Admission: RE | Admit: 2022-10-16 | Discharge: 2022-10-16 | Disposition: A | Discharge status: Home / Self Care | Payer: Medicare Other | Source: Ambulatory Visit | Attending: Internal Medicine | Admitting: Internal Medicine

## 2022-10-16 DIAGNOSIS — Z203 Contact with and (suspected) exposure to rabies: Secondary | ICD-10-CM | POA: Diagnosis not present

## 2022-10-16 DIAGNOSIS — Z23 Encounter for immunization: Secondary | ICD-10-CM

## 2022-10-16 MED ORDER — RABIES VACCINE, PCEC IM SUSR
1.0000 mL | Freq: Once | INTRAMUSCULAR | Status: AC
  Administered 2022-10-16: 1 mL via INTRAMUSCULAR

## 2022-10-16 NOTE — ED Triage Notes (Signed)
 Pt for rabies vaccine #3-denies complications-NAD-steady gait

## 2022-10-23 ENCOUNTER — Ambulatory Visit
Admission: RE | Admit: 2022-10-23 | Discharge: 2022-10-23 | Disposition: A | Discharge status: Home / Self Care | Payer: Medicare Other | Source: Ambulatory Visit

## 2022-10-23 ENCOUNTER — Other Ambulatory Visit: Payer: Self-pay

## 2022-10-23 DIAGNOSIS — Z203 Contact with and (suspected) exposure to rabies: Secondary | ICD-10-CM | POA: Diagnosis not present

## 2022-10-23 DIAGNOSIS — Z23 Encounter for immunization: Secondary | ICD-10-CM

## 2022-10-23 MED ORDER — RABIES VACCINE, PCEC IM SUSR
1.0000 mL | Freq: Once | INTRAMUSCULAR | Status: AC
  Administered 2022-10-23: 1 mL via INTRAMUSCULAR

## 2022-10-23 NOTE — ED Triage Notes (Signed)
Pt presents for last rabies injection.  

## 2022-11-05 ENCOUNTER — Other Ambulatory Visit: Payer: Self-pay | Admitting: Family Medicine

## 2022-11-05 DIAGNOSIS — Z1231 Encounter for screening mammogram for malignant neoplasm of breast: Secondary | ICD-10-CM

## 2022-12-02 ENCOUNTER — Ambulatory Visit
Admission: RE | Admit: 2022-12-02 | Discharge: 2022-12-02 | Disposition: A | Discharge status: Home / Self Care | Payer: Medicare Other | Source: Ambulatory Visit | Attending: Family Medicine | Admitting: Family Medicine

## 2022-12-02 DIAGNOSIS — Z1231 Encounter for screening mammogram for malignant neoplasm of breast: Secondary | ICD-10-CM

## 2023-02-25 ENCOUNTER — Inpatient Hospital Stay (HOSPITAL_COMMUNITY)
Admission: EM | Admit: 2023-02-25 | Discharge: 2023-02-28 | DRG: 690 | Disposition: A | Discharge status: Home / Self Care | Payer: Medicare Other | Attending: Internal Medicine | Admitting: Internal Medicine

## 2023-02-25 DIAGNOSIS — I951 Orthostatic hypotension: Secondary | ICD-10-CM | POA: Diagnosis present

## 2023-02-25 DIAGNOSIS — Z880 Allergy status to penicillin: Secondary | ICD-10-CM

## 2023-02-25 DIAGNOSIS — R55 Syncope and collapse: Secondary | ICD-10-CM | POA: Diagnosis not present

## 2023-02-25 DIAGNOSIS — Z87442 Personal history of urinary calculi: Secondary | ICD-10-CM

## 2023-02-25 DIAGNOSIS — E876 Hypokalemia: Secondary | ICD-10-CM | POA: Diagnosis present

## 2023-02-25 DIAGNOSIS — Z960 Presence of urogenital implants: Secondary | ICD-10-CM

## 2023-02-25 DIAGNOSIS — B964 Proteus (mirabilis) (morganii) as the cause of diseases classified elsewhere: Secondary | ICD-10-CM | POA: Diagnosis present

## 2023-02-25 DIAGNOSIS — N390 Urinary tract infection, site not specified: Secondary | ICD-10-CM | POA: Diagnosis not present

## 2023-02-25 DIAGNOSIS — Z79899 Other long term (current) drug therapy: Secondary | ICD-10-CM

## 2023-02-25 DIAGNOSIS — B9620 Unspecified Escherichia coli [E. coli] as the cause of diseases classified elsewhere: Secondary | ICD-10-CM | POA: Diagnosis present

## 2023-02-25 DIAGNOSIS — Z9049 Acquired absence of other specified parts of digestive tract: Secondary | ICD-10-CM

## 2023-02-25 DIAGNOSIS — Z87891 Personal history of nicotine dependence: Secondary | ICD-10-CM

## 2023-02-25 DIAGNOSIS — I10 Essential (primary) hypertension: Secondary | ICD-10-CM | POA: Diagnosis present

## 2023-02-25 DIAGNOSIS — I471 Supraventricular tachycardia, unspecified: Secondary | ICD-10-CM | POA: Diagnosis present

## 2023-02-25 DIAGNOSIS — R197 Diarrhea, unspecified: Secondary | ICD-10-CM | POA: Diagnosis present

## 2023-02-25 DIAGNOSIS — N3001 Acute cystitis with hematuria: Principal | ICD-10-CM

## 2023-02-25 DIAGNOSIS — N179 Acute kidney failure, unspecified: Secondary | ICD-10-CM | POA: Diagnosis present

## 2023-02-25 LAB — CBC WITH DIFFERENTIAL/PLATELET
Abs Immature Granulocytes: 0.26 10*3/uL — ABNORMAL HIGH (ref 0.00–0.07)
Basophils Absolute: 0.1 10*3/uL (ref 0.0–0.1)
Basophils Relative: 0 %
Eosinophils Absolute: 0.1 10*3/uL (ref 0.0–0.5)
Eosinophils Relative: 1 %
HCT: 33.8 % — ABNORMAL LOW (ref 36.0–46.0)
Hemoglobin: 10.6 g/dL — ABNORMAL LOW (ref 12.0–15.0)
Immature Granulocytes: 2 %
Lymphocytes Relative: 11 %
Lymphs Abs: 1.6 10*3/uL (ref 0.7–4.0)
MCH: 28.7 pg (ref 26.0–34.0)
MCHC: 31.4 g/dL (ref 30.0–36.0)
MCV: 91.6 fL (ref 80.0–100.0)
Monocytes Absolute: 0.7 10*3/uL (ref 0.1–1.0)
Monocytes Relative: 5 %
Neutro Abs: 11.2 10*3/uL — ABNORMAL HIGH (ref 1.7–7.7)
Neutrophils Relative %: 81 %
Platelets: 155 10*3/uL (ref 150–400)
RBC: 3.69 MIL/uL — ABNORMAL LOW (ref 3.87–5.11)
RDW: 15.9 % — ABNORMAL HIGH (ref 11.5–15.5)
WBC: 14 10*3/uL — ABNORMAL HIGH (ref 4.0–10.5)
nRBC: 0 % (ref 0.0–0.2)

## 2023-02-25 LAB — COMPREHENSIVE METABOLIC PANEL
ALT: 19 U/L (ref 0–44)
AST: 23 U/L (ref 15–41)
Albumin: 3.1 g/dL — ABNORMAL LOW (ref 3.5–5.0)
Alkaline Phosphatase: 110 U/L (ref 38–126)
Anion gap: 7 (ref 5–15)
BUN: 15 mg/dL (ref 8–23)
CO2: 17 mmol/L — ABNORMAL LOW (ref 22–32)
Calcium: 8.6 mg/dL — ABNORMAL LOW (ref 8.9–10.3)
Chloride: 113 mmol/L — ABNORMAL HIGH (ref 98–111)
Creatinine, Ser: 1.17 mg/dL — ABNORMAL HIGH (ref 0.44–1.00)
GFR, Estimated: 48 mL/min — ABNORMAL LOW (ref >60)
Glucose, Bld: 151 mg/dL — ABNORMAL HIGH (ref 70–99)
Potassium: 2.9 mmol/L — ABNORMAL LOW (ref 3.5–5.1)
Sodium: 137 mmol/L (ref 135–145)
Total Bilirubin: 0.5 mg/dL (ref <1.2)
Total Protein: 6.5 g/dL (ref 6.5–8.1)

## 2023-02-25 LAB — TROPONIN I (HIGH SENSITIVITY): Troponin I (High Sensitivity): 5 ng/L (ref <18)

## 2023-02-25 MED ORDER — LACTATED RINGERS IV BOLUS
1000.0000 mL | Freq: Once | INTRAVENOUS | Status: AC
  Administered 2023-02-25: 1000 mL via INTRAVENOUS

## 2023-02-25 NOTE — ED Provider Notes (Signed)
[place] EMERGENCY DEPARTMENT AT [facility] Provider Note   CSN: [id number] Arrival date & time: 02/25/23  2104     History  No chief complaint on file.   Leslie Patton is a 77 y.o. female with PMH as listed below who presents BIBA from home for syncopal episode.  Accompanied by her daughter at bedside.  Patient had a R ureteral stent placed over thanksgiving while visiting [place] on the right side for a stone. She was told to return home and make a follow-up appointment with a urologist to have it removed, but she states currently the stent still in place. Pt c/o hematuria, nausea, 5-6 episodes of NB diarrhea today, and a syncopal episode at home. She was sitting on the toilet and then "the next thing I knew I was on the ground."  She states she did not hit her head and has no headache or any pain/trauma from the fall.  She states she did feel "not right" before the syncopal episode took place and has intermittently felt dizzy with standing.  She reports that she has been eating and drinking well.  She denies any abdominal or flank pain, fever/chills, chest pain, palpitations, shortness of breath, leg swelling, history of DVT or PE.  Per EMS, she had positive orthostatics. EMS reports possible SVT also. 4 zofran, 500 LR 20 given PTA. Reported orthostatics: BP 110/60 lying, 80/40 sitting HR 60 lying,100 sitting  Past Medical History:  Diagnosis Date   Anxiety    Hypertension    OCD (obsessive compulsive disorder)    Vitamin D deficiency        Home Medications Prior to Admission medications   Medication Sig Start Date End Date Taking? Authorizing Provider  acetaminophen (TYLENOL) 500 MG tablet Take 500 mg by mouth every 6 (six) hours as needed for moderate pain.    [provider]  ALPRAZolam Prudy Feeler) 0.5 MG tablet Take 0.5 mg by mouth every morning.    [provider]  calcium carbonate (OS-CAL - DOSED IN MG OF ELEMENTAL CALCIUM) 1250 (500 Ca)  MG tablet Take 2 tablets by mouth daily with breakfast.    [provider]  cefUROXime (CEFTIN) 500 MG tablet Take 1 tablet (500 mg total) by mouth 2 (two) times daily with a meal. 10/13/22   Wallis Bamberg, PA-C  cholecalciferol (VITAMIN D) 1000 UNITS tablet Take 2,000 Units by mouth daily.     [provider]  donepezil (ARICEPT) 5 MG tablet Take 5 mg by mouth at bedtime.    [provider]  DULoxetine (CYMBALTA) 60 MG capsule Take 60 mg by mouth daily.    [provider]  fluvoxaMINE (LUVOX) 100 MG tablet Take 100 mg by mouth at bedtime.    [provider]  Folic Acid-Vit B6-Vit B12 (FOLBEE) 2.5-25-1 MG TABS tablet Take 2 tablets by mouth daily.    [provider]  hydrochlorothiazide (HYDRODIURIL) 25 MG tablet Take 25 mg by mouth daily.    [provider]  losartan (COZAAR) 25 MG tablet Take 25 mg by mouth daily.    [provider]  metroNIDAZOLE (FLAGYL) 500 MG tablet Take 1 tablet (500 mg total) by mouth 3 (three) times daily. 10/13/22   Wallis Bamberg, PA-C  Multiple Vitamins-Minerals (CENTRUM SILVER) tablet Take 1 tablet by mouth daily.    [provider]  OLANZapine (ZYPREXA) 2.5 MG tablet Take 2.5 mg by mouth at bedtime.    [provider]  Allergies    Penicillins    Review of Systems   Review of Systems A 10 point review of systems was performed and is negative unless otherwise reported in HPI.  Physical Exam Updated Vital Signs BP 120/77   Pulse 86   Temp 98.3 F (36.8 C) (Oral)   Resp 18   Ht 5\' 3"  (1.6 m)   Wt 65.6 kg   SpO2 97%   BMI 25.62 kg/m  Physical Exam General: Normal appearing female, lying in bed.  HEENT: PERRLA, Sclera anicteric, MMM, trachea midline. NCAT. No midline C-spine TTP, deformities, or stepoffs.  Cardiology: RRR, no murmurs/rubs/gallops.  Resp: Normal respiratory rate and effort. CTAB, no wheezes, rhonchi, crackles.  Abd: Soft, non-tender, non-distended. No  rebound tenderness or guarding.  Pelvis: Pelvis stable/nontender MSK: No peripheral edema or signs of trauma. Extremities without deformity or TTP.  Skin: warm, dry. No rashes or lesions. Back: No CVA tenderness. No Midline T or L spine TTP or stepoffs.  Neuro: A&Ox4, CNs II-XII grossly intact. MAEs. Sensation grossly intact.  Psych: Normal mood and affect.   ED Results / Procedures / Treatments   Labs (all labs ordered are listed, but only abnormal results are displayed) Labs Reviewed  COMPREHENSIVE METABOLIC PANEL - Abnormal; Notable for the following components:   Potassium 2.9 (*)    Chloride 113 (*)    CO2 17 (*)    Glucose, Bld 151 (*)    Creatinine, Ser 1.17 (*)    Calcium 8.6 (*)    Albumin 3.1 (*)    GFR, Estimated 48 (*)    All other components within normal limits  URINALYSIS, ROUTINE W REFLEX MICROSCOPIC - Abnormal; Notable for the following components:   APPearance CLOUDY (*)    Hgb urine dipstick LARGE (*)    Protein, ur 100 (*)    Leukocytes,Ua LARGE (*)    Bacteria, UA FEW (*)    All other components within normal limits  CBC WITH DIFFERENTIAL/PLATELET - Abnormal; Notable for the following components:   WBC 14.0 (*)    RBC 3.69 (*)    Hemoglobin 10.6 (*)    HCT 33.8 (*)    RDW 15.9 (*)    Neutro Abs 11.2 (*)    Abs Immature Granulocytes 0.26 (*)    All other components within normal limits  MAGNESIUM  CBC WITH DIFFERENTIAL/PLATELET  BASIC METABOLIC PANEL  TROPONIN I (HIGH SENSITIVITY)  TROPONIN I (HIGH SENSITIVITY)    EKG EKG Interpretation Date/Time:  Friday February 26 2023 00:05:32 EST Ventricular Rate:  89 PR Interval:  205 QRS Duration:  129 QT Interval:  386 QTC Calculation: 470 R Axis:   14  Text Interpretation: Incomplete analysis due to missing data in precordial lead(s) Sinus rhythm Right bundle branch block Missing lead(s): V3 Technically poor tracing Confirmed by Ross Marcus ([id number]) on 02/27/2023 2:49:04 PM  Radiology No  results found.  Procedures Procedures    Medications Ordered in ED Medications  ceFEPIme (MAXIPIME) 2 g in sodium chloride 0.9 % 100 mL IVPB (2 g Intravenous New Bag/Given 02/27/23 2105)  lactated ringers bolus 1,000 mL (1,000 mLs Intravenous New Bag/Given 02/25/23 2232)    ED Course/ Medical Decision Making/ A&P                          Medical Decision Making Amount and/or Complexity of Data Reviewed Labs: ordered. Decision-making details documented in ED Course.  Risk Decision regarding hospitalization.    This  patient presents to the ED for concern of diarrhea, syncope, hematuria, orthostatics+, this involves an extensive number of treatment options, and is a complaint that carries with it a high risk of complications and morbidity.  I considered the following differential and admission for this acute, potentially life threatening condition.   MDM:    DDX for syncope includes but is not limited to:  Several reasons that this patient could have syncopized.  She reports hematuria with recent stent placement and she could have UTI, will test her UA.  She reports diarrhea as well and has felt dizzy with positive orthostatics, consider orthostatic syncope, she also was on the toilet when she syncopized, consider vasovagal syncope.  She denies any chest pain or shortness of breath to indicate ACS though EMS does report possible SVT.  Patient also seems to report a lack of prodromal symptoms prior to the syncope stating that "the next thing I knew I was on the ground."  Consider possible arrhythmia the patient is monitored on telemetry. Consider anemia and electrolyte abnormalities as possible etiologies. Lower suspicion for PE given normal vital signs, absence of chest pain or dyspnea, no evidence of DVT, though patient did recently have procedure/hospitalization, but still lower on differential.  Clinical Course as of 02/28/23 0233  Thu Feb 25, 2023  2354 Creatinine(!): 1.17 +AKI  [HN]  2354 Potassium(!): 2.9 +hypokalemia, likely d/t diarrhea today, will replete [HN]  2355 CO2(!): 17 +NAGMA d/t diarrhea [HN]  2355 WBC(!): 14.0 +leukocytosis w/ left shift [HN]  2356 Hemoglobin(!): 10.6 BL 9-10, likely some volume contraction [HN]  Fri Feb 26, 2023  0011 Urinalysis, Routine w reflex microscopic -Urine, Clean Catch(!) +UTI [HN]    Clinical Course User Index [HN] Loetta Rough, MD    Labs: I Ordered, and personally interpreted labs.  The pertinent results include: Those listed above  Additional history obtained from chart review, daughter at bedside.    Cardiac Monitoring: The patient was maintained on a cardiac monitor.  I personally viewed and interpreted the cardiac monitored which showed an underlying rhythm of: Normal sinus rhythm  Reevaluation: After the interventions noted above, I reevaluated the patient and found that they have :improved  Social Determinants of Health: Lives independently  Disposition: Patient is found to have an AKI, hypokalemia, NAGMA likely due to diarrhea, as well as a UTI.  Patient is given empiric cefepime and fluids, blood cultures are drawn, and patient will be admitted to medicine.  Co morbidities that complicate the patient evaluation  Past Medical History:  Diagnosis Date   Anxiety    Hypertension    OCD (obsessive compulsive disorder)    Vitamin D deficiency      Medicines No orders of the defined types were placed in this encounter.   I have reviewed the patients home medicines and have made adjustments as needed  Problem List / ED Course: Problem List Items Addressed This Visit       Genitourinary   * (Principal) UTI (urinary tract infection) - Primary   UTI with recent urologic instrumentation (stent placement for stone). Empiric cefepime UCx pending Repeat CBC in AM      Relevant Medications   ceFEPIme (MAXIPIME) 2 g in sodium chloride 0.9 % 100 mL IVPB     Other   Hypokalemia   Other  Visit Diagnoses       Syncope and collapse         AKI (acute kidney injury) (HCC)  Diarrhea, unspecified type                       This note was created using dictation software, which may contain spelling or grammatical errors.    Loetta Rough, MD 02/28/23 [phone]

## 2023-02-25 NOTE — ED Triage Notes (Signed)
BIBA from home for syncopal episode. Pt had a stent placed over thanksgiving- stent still in place. Pt c/o hematuria, nausea, diarrhea, syncopal episode at home. Positive orthostatic. EMS reports SVT also. 4 zofran, 500 LR 20 given PTA.  110/60 lying, 80/40 sit 60 lying,100 sit

## 2023-02-26 ENCOUNTER — Encounter (HOSPITAL_COMMUNITY): Payer: Self-pay | Admitting: Internal Medicine

## 2023-02-26 ENCOUNTER — Other Ambulatory Visit: Payer: Self-pay

## 2023-02-26 DIAGNOSIS — B964 Proteus (mirabilis) (morganii) as the cause of diseases classified elsewhere: Secondary | ICD-10-CM | POA: Diagnosis present

## 2023-02-26 DIAGNOSIS — N179 Acute kidney failure, unspecified: Secondary | ICD-10-CM | POA: Diagnosis present

## 2023-02-26 DIAGNOSIS — Z9049 Acquired absence of other specified parts of digestive tract: Secondary | ICD-10-CM | POA: Diagnosis not present

## 2023-02-26 DIAGNOSIS — I951 Orthostatic hypotension: Secondary | ICD-10-CM | POA: Diagnosis present

## 2023-02-26 DIAGNOSIS — Z79899 Other long term (current) drug therapy: Secondary | ICD-10-CM | POA: Diagnosis not present

## 2023-02-26 DIAGNOSIS — N3001 Acute cystitis with hematuria: Secondary | ICD-10-CM

## 2023-02-26 DIAGNOSIS — I471 Supraventricular tachycardia, unspecified: Secondary | ICD-10-CM | POA: Diagnosis present

## 2023-02-26 DIAGNOSIS — Z880 Allergy status to penicillin: Secondary | ICD-10-CM | POA: Diagnosis not present

## 2023-02-26 DIAGNOSIS — R55 Syncope and collapse: Secondary | ICD-10-CM | POA: Diagnosis present

## 2023-02-26 DIAGNOSIS — I10 Essential (primary) hypertension: Secondary | ICD-10-CM | POA: Diagnosis present

## 2023-02-26 DIAGNOSIS — B9620 Unspecified Escherichia coli [E. coli] as the cause of diseases classified elsewhere: Secondary | ICD-10-CM | POA: Diagnosis present

## 2023-02-26 DIAGNOSIS — Z960 Presence of urogenital implants: Secondary | ICD-10-CM | POA: Diagnosis not present

## 2023-02-26 DIAGNOSIS — N390 Urinary tract infection, site not specified: Secondary | ICD-10-CM | POA: Diagnosis present

## 2023-02-26 DIAGNOSIS — E876 Hypokalemia: Secondary | ICD-10-CM | POA: Diagnosis present

## 2023-02-26 DIAGNOSIS — Z87442 Personal history of urinary calculi: Secondary | ICD-10-CM | POA: Diagnosis not present

## 2023-02-26 DIAGNOSIS — Z87891 Personal history of nicotine dependence: Secondary | ICD-10-CM | POA: Diagnosis not present

## 2023-02-26 DIAGNOSIS — R197 Diarrhea, unspecified: Secondary | ICD-10-CM | POA: Diagnosis present

## 2023-02-26 LAB — CBC
HCT: 30.5 % — ABNORMAL LOW (ref 36.0–46.0)
Hemoglobin: 9.6 g/dL — ABNORMAL LOW (ref 12.0–15.0)
MCH: 28.9 pg (ref 26.0–34.0)
MCHC: 31.5 g/dL (ref 30.0–36.0)
MCV: 91.9 fL (ref 80.0–100.0)
Platelets: 135 10*3/uL — ABNORMAL LOW (ref 150–400)
RBC: 3.32 MIL/uL — ABNORMAL LOW (ref 3.87–5.11)
RDW: 16 % — ABNORMAL HIGH (ref 11.5–15.5)
WBC: 10.3 10*3/uL (ref 4.0–10.5)
nRBC: 0 % (ref 0.0–0.2)

## 2023-02-26 LAB — URINALYSIS, ROUTINE W REFLEX MICROSCOPIC
Bilirubin Urine: NEGATIVE
Glucose, UA: NEGATIVE mg/dL
Ketones, ur: NEGATIVE mg/dL
Nitrite: NEGATIVE
Protein, ur: 100 mg/dL — AB
RBC / HPF: >50 RBC/hpf (ref 0–5)
Specific Gravity, Urine: 1.006 (ref 1.005–1.030)
WBC, UA: >50 WBC/hpf (ref 0–5)
pH: 5 (ref 5.0–8.0)

## 2023-02-26 LAB — BASIC METABOLIC PANEL
Anion gap: 8 (ref 5–15)
BUN: 15 mg/dL (ref 8–23)
CO2: 19 mmol/L — ABNORMAL LOW (ref 22–32)
Calcium: 8.4 mg/dL — ABNORMAL LOW (ref 8.9–10.3)
Chloride: 111 mmol/L (ref 98–111)
Creatinine, Ser: 0.78 mg/dL (ref 0.44–1.00)
GFR, Estimated: >60 mL/min (ref >60)
Glucose, Bld: 121 mg/dL — ABNORMAL HIGH (ref 70–99)
Potassium: 3.3 mmol/L — ABNORMAL LOW (ref 3.5–5.1)
Sodium: 138 mmol/L (ref 135–145)

## 2023-02-26 LAB — MAGNESIUM: Magnesium: 1.9 mg/dL (ref 1.7–2.4)

## 2023-02-26 LAB — TROPONIN I (HIGH SENSITIVITY): Troponin I (High Sensitivity): 6 ng/L (ref <18)

## 2023-02-26 MED ORDER — LOSARTAN POTASSIUM 25 MG PO TABS
25.0000 mg | ORAL_TABLET | Freq: Every day | ORAL | Status: DC
  Administered 2023-02-26 – 2023-02-28 (×3): 25 mg via ORAL
  Filled 2023-02-26 (×3): qty 1

## 2023-02-26 MED ORDER — ENOXAPARIN SODIUM 40 MG/0.4ML IJ SOSY
40.0000 mg | PREFILLED_SYRINGE | INTRAMUSCULAR | Status: DC
  Administered 2023-02-26 – 2023-02-28 (×3): 40 mg via SUBCUTANEOUS
  Filled 2023-02-26 (×3): qty 0.4

## 2023-02-26 MED ORDER — POTASSIUM CHLORIDE CRYS ER 20 MEQ PO TBCR
40.0000 meq | EXTENDED_RELEASE_TABLET | Freq: Once | ORAL | Status: AC
  Administered 2023-02-26: 40 meq via ORAL
  Filled 2023-02-26: qty 2

## 2023-02-26 MED ORDER — DONEPEZIL HCL 10 MG PO TABS
5.0000 mg | ORAL_TABLET | Freq: Every day | ORAL | Status: DC
  Administered 2023-02-26 – 2023-02-27 (×2): 5 mg via ORAL
  Filled 2023-02-26 (×2): qty 1

## 2023-02-26 MED ORDER — OLANZAPINE 2.5 MG PO TABS
2.5000 mg | ORAL_TABLET | Freq: Every day | ORAL | Status: DC
  Administered 2023-02-26 – 2023-02-27 (×2): 2.5 mg via ORAL
  Filled 2023-02-26 (×2): qty 1

## 2023-02-26 MED ORDER — FLUVOXAMINE MALEATE 50 MG PO TABS
100.0000 mg | ORAL_TABLET | Freq: Every day | ORAL | Status: DC
  Administered 2023-02-26 – 2023-02-27 (×2): 100 mg via ORAL
  Filled 2023-02-26 (×2): qty 2

## 2023-02-26 MED ORDER — DULOXETINE HCL 60 MG PO CPEP
60.0000 mg | ORAL_CAPSULE | Freq: Every day | ORAL | Status: DC
  Administered 2023-02-26 – 2023-02-28 (×3): 60 mg via ORAL
  Filled 2023-02-26 (×3): qty 1

## 2023-02-26 MED ORDER — ACETAMINOPHEN 325 MG PO TABS
650.0000 mg | ORAL_TABLET | Freq: Four times a day (QID) | ORAL | Status: DC | PRN
Start: 2023-02-26 — End: 2023-02-28

## 2023-02-26 MED ORDER — ONDANSETRON HCL 4 MG/2ML IJ SOLN: 4.0000 mg | Freq: Four times a day (QID) | INTRAMUSCULAR | Status: DC | PRN

## 2023-02-26 MED ORDER — SODIUM CHLORIDE 0.9 % IV SOLN
2.0000 g | INTRAVENOUS | Status: DC
  Administered 2023-02-26 – 2023-02-27 (×2): 2 g via INTRAVENOUS
  Filled 2023-02-26 (×2): qty 12.5

## 2023-02-26 MED ORDER — SODIUM CHLORIDE 0.9 % IV SOLN
2.0000 g | Freq: Once | INTRAVENOUS | Status: AC
  Administered 2023-02-26: 2 g via INTRAVENOUS
  Filled 2023-02-26: qty 12.5

## 2023-02-26 MED ORDER — ACETAMINOPHEN 650 MG RE SUPP: 650.0000 mg | Freq: Four times a day (QID) | RECTAL | Status: DC | PRN

## 2023-02-26 MED ORDER — ONDANSETRON HCL 4 MG PO TABS
4.0000 mg | ORAL_TABLET | Freq: Four times a day (QID) | ORAL | Status: DC | PRN
Start: 2023-02-26 — End: 2023-02-28

## 2023-02-26 NOTE — Progress Notes (Signed)
Pt is up to unit from ED with  paramedic at side. Pt appears warm, dry, no visible distress

## 2023-02-26 NOTE — Progress Notes (Signed)
   02/26/23 1004  TOC Brief Assessment  Insurance and Status Reviewed  Patient has primary care physician Yes  Home environment has been reviewed Resides with spouse at home  Prior level of function: Independent at baseline  Prior/Current Home Services No current home services  Social Drivers of Health Review SDOH reviewed no interventions necessary  Readmission risk has been reviewed Yes  Transition of care needs no transition of care needs at this time

## 2023-02-26 NOTE — Progress Notes (Signed)
Pharmacy Antibiotic Note  Leslie Patton is a 77 y.o. female admitted on 02/25/2023 with recent ureteral stent placement for stone over thanksgiving, stent still in place. Pt has nausea, hematuria, diarrhea and syncopal episode today.  Pharmacy has been consulted to dose cefepime for UTI.  Plan: Cefepime 2gm IV q24h Follow renal function, cultures and clinical course  Height: 5\' 3"  (160 cm) Weight: 65.6 kg (144 lb 10 oz) IBW/kg (Calculated) : 52.4  Temp (24hrs), Avg:97.8 F (36.6 C), Min:97.7 F (36.5 C), Max:97.9 F (36.6 C)  Recent Labs  Lab 02/25/23 2231 02/25/23 2340  WBC  --  14.0*  CREATININE 1.17*  --     Estimated Creatinine Clearance: 36.1 mL/min (A) (by C-G formula based on SCr of 1.17 mg/dL (H)).    Allergies  Allergen Reactions   Penicillins Hives    Antimicrobials this admission: 12/27 cefepime >>  Dose adjustments this admission:   Microbiology results: 12/27 UCx:     Thank you for allowing pharmacy to be a part of this patient's care. Arley Phenix RPh 02/26/2023, 3:08 AM

## 2023-02-26 NOTE — H&P (Signed)
History and Physical    Patient: Leslie Patton YQM:[id number] DOB: August 28, 1945 DOA: 02/25/2023 DOS: the patient was seen and examined on 02/26/2023 PCP: Gweneth Dimitri, MD  Patient coming from: Home  Chief Complaint: No chief complaint on file.  HPI: MARKEE MCLENDON is a 77 y.o. female with medical history significant of HTN.  Pt with recent ureteral stent placement for stone over thanksgiving.  Stent still in place.  Pt with nausea, hematuria, diarrhea, and syncopal episode today at home.  Pt in to ED today following syncopal episode.  Pt with positive orthostatic vitals and had SVT with EMS as well (110/60 lying down, 80/40 sitting).  Pt supposed to get stent removed, but it seems that her follow up urology visit was scheduled with a provider who doesn't perform that procedure?    Review of Systems: As mentioned in the history of present illness. All other systems reviewed and are negative. Past Medical History:  Diagnosis Date   Anxiety    Hypertension    OCD (obsessive compulsive disorder)    Vitamin D deficiency    Past Surgical History:  Procedure Laterality Date   LAPAROSCOPIC CHOLECYSTECTOMY SINGLE PORT N/A 03/20/2017   Procedure: LAPAROSCOPIC CHOLECYSTECTOMY;  Surgeon: Karie Soda, MD;  Location: [facility];  Service: General;  Laterality: N/A;   TUBAL LIGATION     Social History:  reports that she has quit smoking. She has never used smokeless tobacco. She reports current alcohol use. She reports that she does not use drugs.  Allergies  Allergen Reactions   Penicillins Hives    No family history on file.  Prior to Admission medications   Medication Sig Start Date End Date Taking? Authorizing Provider  acetaminophen (TYLENOL) 500 MG tablet Take 500 mg by mouth every 6 (six) hours as needed for moderate pain.    [provider]  ALPRAZolam Prudy Feeler) 0.5 MG tablet Take 0.5 mg by mouth every morning.    [provider]  calcium carbonate  (OS-CAL - DOSED IN MG OF ELEMENTAL CALCIUM) 1250 (500 Ca) MG tablet Take 2 tablets by mouth daily with breakfast.    [provider]  cefUROXime (CEFTIN) 500 MG tablet Take 1 tablet (500 mg total) by mouth 2 (two) times daily with a meal. 10/13/22   Wallis Bamberg, PA-C  cholecalciferol (VITAMIN D) 1000 UNITS tablet Take 2,000 Units by mouth daily.     [provider]  donepezil (ARICEPT) 5 MG tablet Take 5 mg by mouth at bedtime.    [provider]  DULoxetine (CYMBALTA) 60 MG capsule Take 60 mg by mouth daily.    [provider]  fluvoxaMINE (LUVOX) 100 MG tablet Take 100 mg by mouth at bedtime.    [provider]  Folic Acid-Vit B6-Vit B12 (FOLBEE) 2.5-25-1 MG TABS tablet Take 2 tablets by mouth daily.    [provider]  hydrochlorothiazide (HYDRODIURIL) 25 MG tablet Take 25 mg by mouth daily.    [provider]  losartan (COZAAR) 25 MG tablet Take 25 mg by mouth daily.    [provider]  metroNIDAZOLE (FLAGYL) 500 MG tablet Take 1 tablet (500 mg total) by mouth 3 (three) times daily. 10/13/22   Wallis Bamberg, PA-C  Multiple Vitamins-Minerals (CENTRUM SILVER) tablet Take 1 tablet by mouth daily.    [provider]  OLANZapine (ZYPREXA) 2.5 MG tablet Take 2.5 mg by mouth at bedtime.    [provider]    Physical Exam: Vitals:   02/26/23 0000  BP: 137/86  Pulse: 85  Resp: 18  Temp: 97.7 F (36.5 C)  TempSrc: Oral  SpO2: 98%   Constitutional: NAD, calm, comfortable Respiratory: clear to auscultation bilaterally, no wheezing, no crackles. Normal respiratory effort. No accessory muscle use.  Cardiovascular: Regular rate and rhythm, no murmurs / rubs / gallops. No extremity edema. 2+ pedal pulses. No carotid bruits.  Abdomen: no tenderness, no masses palpated. No hepatosplenomegaly. Bowel sounds positive.  Neurologic: CN 2-12 grossly intact. Sensation intact, DTR normal. Strength 5/5 in all 4.   Psychiatric: Normal judgment and insight. Alert and oriented x 3. Normal mood.   Data Reviewed:    Labs on Admission: I have personally reviewed following labs and imaging studies  CBC: Recent Labs  Lab 02/25/23 2340  WBC 14.0*  NEUTROABS 11.2*  HGB 10.6*  HCT 33.8*  MCV 91.6  PLT 155   Basic Metabolic Panel: Recent Labs  Lab 02/25/23 2231  NA 137  K 2.9*  CL 113*  CO2 17*  GLUCOSE 151*  BUN 15  CREATININE 1.17*  CALCIUM 8.6*   GFR: Estimated Creatinine Clearance: 36.1 mL/min (A) (by C-G formula based on SCr of 1.17 mg/dL (H)). Liver Function Tests: Recent Labs  Lab 02/25/23 2231  AST 23  ALT 19  ALKPHOS 110  BILITOT 0.5  PROT 6.5  ALBUMIN 3.1*   No results for input(s): "LIPASE", "AMYLASE" in the last 168 hours. No results for input(s): "AMMONIA" in the last 168 hours. Coagulation Profile: No results for input(s): "INR", "PROTIME" in the last 168 hours. Cardiac Enzymes: No results for input(s): "CKTOTAL", "CKMB", "CKMBINDEX", "TROPONINI" in the last 168 hours. BNP (last 3 results) No results for input(s): "PROBNP" in the last 8760 hours. HbA1C: No results for input(s): "HGBA1C" in the last 72 hours. CBG: No results for input(s): "GLUCAP" in the last 168 hours. Lipid Profile: No results for input(s): "CHOL", "HDL", "LDLCALC", "TRIG", "CHOLHDL", "LDLDIRECT" in the last 72 hours. Thyroid Function Tests: No results for input(s): "TSH", "T4TOTAL", "FREET4", "T3FREE", "THYROIDAB" in the last 72 hours. Anemia Panel: No results for input(s): "VITAMINB12", "FOLATE", "FERRITIN", "TIBC", "IRON", "RETICCTPCT" in the last 72 hours. Urine analysis:    Component Value Date/Time   COLORURINE YELLOW 02/25/2023 2341   APPEARANCEUR CLOUDY (A) 02/25/2023 2341   LABSPEC 1.006 02/25/2023 2341   PHURINE 5.0 02/25/2023 2341   GLUCOSEU NEGATIVE 02/25/2023 2341   HGBUR LARGE (A) 02/25/2023 2341   BILIRUBINUR NEGATIVE 02/25/2023 2341   KETONESUR NEGATIVE 02/25/2023  2341   PROTEINUR 100 (A) 02/25/2023 2341   UROBILINOGEN 0.2 08/23/2012 1015   NITRITE NEGATIVE 02/25/2023 2341   LEUKOCYTESUR LARGE (A) 02/25/2023 2341    Radiological Exams on Admission: No results found.  EKG: Independently reviewed.   Assessment and Plan: * UTI (urinary tract infection) UTI with recent urologic instrumentation (stent placement for stone). Empiric cefepime UCx pending Repeat CBC in AM  Syncope due to orthostatic hypotension Syncope earlier today very likely due to patients orthostasis which is confirmed on ED vitals. Orthostasis in setting of acute illness (UTI). Hold losartan and hydrochlorothiazide IVF: 1L bolus in ED Treat UTI as above. Replace K Check Mg  Ureteral stent present Apparently due for removal but saw urology provider who didn't perform that particular procedure? Call urology in AM so they can figure out plan regarding removal / timing.      Advance Care Planning:   Code Status: Full Code  Consults: None, call urology in AM  Family Communication: No family in room  Severity of Illness: The appropriate patient status for this patient is OBSERVATION. Observation status is judged to be reasonable and necessary in order to provide the required intensity of service to ensure the patient's safety. The patient's presenting symptoms, physical exam findings, and initial radiographic and laboratory data in the context of their medical condition is felt to place them at decreased risk for further clinical deterioration. Furthermore, it is anticipated that the patient will be medically stable for discharge from the hospital within 2 midnights of admission.   Author: Hillary Bow., DO 02/26/2023 1:06 AM  For on call review [email or web address].

## 2023-02-26 NOTE — Assessment & Plan Note (Signed)
UTI with recent urologic instrumentation (stent placement for stone). Empiric cefepime UCx pending Repeat CBC in AM

## 2023-02-26 NOTE — Assessment & Plan Note (Signed)
Apparently due for removal but saw urology provider who didn't perform that particular procedure? Call urology in AM so they can figure out plan regarding removal / timing.

## 2023-02-26 NOTE — Plan of Care (Signed)
  Problem: Education: Goal: Knowledge of General Education information will improve Description Including pain rating scale, medication(s)/side effects and non-pharmacologic comfort measures Outcome: Progressing   

## 2023-02-26 NOTE — Progress Notes (Addendum)
  PROGRESS NOTE  Patient admitted earlier this morning. See H&P.   Leslie Patton is a 77 year old female with past medical history significant for hypertension.  She presents with symptoms of nausea, diarrhea, hematuria and syncopal episode at home.  Apparently, she was not feeling well, got out of bed and fell in between her bed and nightstand.  EMS found that patient was orthostatic and had SVT.  She was diagnosed with UTI and syncope due to orthostatic hypotension and admitted to the hospital.  Additionally, she was in [facility] in November, and presented to [facility] with abdominal pain, right-side back pain, weakness, urinary frequency, nausea.  At that time, she was diagnosed with sepsis, AKI, obstructive uropathy, right kidney stone with hydronephrosis and underwent ureteral stent placement on 01/26/23.  She was supposed to have stent removed, and followed up with [facility], but saw a physician who does not remove stent.  She is still waiting to hear about getting another appointment.   On examination today, patient is feeling well without any complaints.  Continue empiric cefepime Urine culture is pending Replace potassium Check orthostatic vital sign I will reach out to urology for clarification on ureteral stent removal timing  Continue home meds including Aricept, Cymbalta, Luvox, Cozaar, Zyprexa   Status is: Observation The patient will require care spanning > 2 midnights and should be moved to inpatient because: IV antibiotics   [name], DO [facility] 02/26/2023, 1:00 PM  Available via Epic secure chat 7am-7pm After these hours, please refer to coverage provider listed on [email or web address]

## 2023-02-26 NOTE — ED Notes (Signed)
ED TO INPATIENT HANDOFF REPORT  Name/Age/Gender Leslie Patton 77 y.o. female  Code Status    Code Status Orders  (From admission, onward)           Start     Ordered   02/26/23 0102  Full code  Continuous       Question:  By:  Answer:  Default: patient does not have capacity for decision making, no surrogate or prior directive available   02/26/23 0101           Code Status History     Date Active Date Inactive Code Status Order ID Comments User Context   03/20/2017 0331 03/29/2017 1411 Full Code [id number]  Karie Soda, MD ED       Home/SNF/Other Home  Chief Complaint UTI (urinary tract infection) [N39.0]  Level of Care/Admitting Diagnosis ED Disposition     ED Disposition  Admit   Condition  --   Comment  Hospital Area: [facility] [[id number]]  Level of Care: Telemetry [5]  Admit to tele based on following criteria: Eval of Syncope  May place patient in observation at [facility] or [facility] if equivalent level of care is available:: No  Covid Evaluation: Asymptomatic - no recent exposure (last 10 days) testing not required  Diagnosis: UTI (urinary tract infection) [409811]  Admitting Physician: Hillary Bow [phone]  Attending Physician: Hillary Bow [phone]          Medical History Past Medical History:  Diagnosis Date   Anxiety    Hypertension    OCD (obsessive compulsive disorder)    Vitamin D deficiency     Allergies Allergies  Allergen Reactions   Penicillins Hives    IV Location/Drains/Wounds Patient Lines/Drains/Airways Status     Active Line/Drains/Airways     Name Placement date Placement time Site Days   Peripheral IV 02/25/23 20 G Anterior;Left;Proximal Forearm 02/25/23  2128  Forearm  1   Closed System Drain 1 Abdomen Bulb (JP) 19 Fr. 03/20/17  1128  Abdomen  2169   Incision - 3 Ports Abdomen 1: Right 2: Umbilicus 3: Superior 03/20/17  1123  -- 2169            Labs/Imaging Results  for orders placed or performed during the hospital encounter of 02/25/23 (from the past 48 hours)  Comprehensive metabolic panel     Status: Abnormal   Collection Time: 02/25/23 10:31 PM  Result Value Ref Range   Sodium 137 135 - 145 mmol/L   Potassium 2.9 (L) 3.5 - 5.1 mmol/L   Chloride 113 (H) 98 - 111 mmol/L   CO2 17 (L) 22 - 32 mmol/L   Glucose, Bld 151 (H) 70 - 99 mg/dL    Comment: Glucose reference range applies only to samples taken after fasting for at least 8 hours.   BUN 15 8 - 23 mg/dL   Creatinine, Ser 6.21 (H) 0.44 - 1.00 mg/dL   Calcium 8.6 (L) 8.9 - 10.3 mg/dL   Total Protein 6.5 6.5 - 8.1 g/dL   Albumin 3.1 (L) 3.5 - 5.0 g/dL   AST 23 15 - 41 U/L   ALT 19 0 - 44 U/L   Alkaline Phosphatase 110 38 - 126 U/L   Total Bilirubin 0.5 <1.2 mg/dL   GFR, Estimated 48 (L) >60 mL/min    Comment: (NOTE) Calculated using the CKD-EPI Creatinine Equation (2021)    Anion gap 7 5 - 15    Comment: Performed at [facility], [address] [place], [place] [address]  Troponin I (High Sensitivity)     Status: None   Collection Time: 02/25/23 10:31 PM  Result Value Ref Range   Troponin I (High Sensitivity) 5 <18 ng/L    Comment: (NOTE) Elevated high sensitivity troponin I (hsTnI) values and significant  changes across serial measurements may suggest ACS but many other  chronic and acute conditions are known to elevate hsTnI results.  Refer to the "Links" section for chest pain algorithms and additional  guidance. Performed at [facility], [address] [place], [place] [address]   CBC with Differential/Platelet     Status: Abnormal   Collection Time: 02/25/23 11:40 PM  Result Value Ref Range   WBC 14.0 (H) 4.0 - 10.5 K/uL   RBC 3.69 (L) 3.87 - 5.11 MIL/uL   Hemoglobin 10.6 (L) 12.0 - 15.0 g/dL   HCT 09.8 (L) 11.9 - 14.7 %   MCV 91.6 80.0 - 100.0 fL   MCH 28.7 26.0 - 34.0 pg   MCHC 31.4 30.0 - 36.0 g/dL   RDW 82.9 (H) 56.2 - 13.0 %    Platelets 155 150 - 400 K/uL   nRBC 0.0 0.0 - 0.2 %   Neutrophils Relative % 81 %   Neutro Abs 11.2 (H) 1.7 - 7.7 K/uL   Lymphocytes Relative 11 %   Lymphs Abs 1.6 0.7 - 4.0 K/uL   Monocytes Relative 5 %   Monocytes Absolute 0.7 0.1 - 1.0 K/uL   Eosinophils Relative 1 %   Eosinophils Absolute 0.1 0.0 - 0.5 K/uL   Basophils Relative 0 %   Basophils Absolute 0.1 0.0 - 0.1 K/uL   Immature Granulocytes 2 %   Abs Immature Granulocytes 0.26 (H) 0.00 - 0.07 K/uL    Comment: Performed at [facility], [address], [place], [place] [address]  Urinalysis, Routine w reflex microscopic -Urine, Clean Catch     Status: Abnormal   Collection Time: 02/25/23 11:41 PM  Result Value Ref Range   Color, Urine YELLOW YELLOW   APPearance CLOUDY (A) CLEAR   Specific Gravity, Urine 1.006 1.005 - 1.030   pH 5.0 5.0 - 8.0   Glucose, UA NEGATIVE NEGATIVE mg/dL   Hgb urine dipstick LARGE (A) NEGATIVE   Bilirubin Urine NEGATIVE NEGATIVE   Ketones, ur NEGATIVE NEGATIVE mg/dL   Protein, ur 469 (A) NEGATIVE mg/dL   Nitrite NEGATIVE NEGATIVE   Leukocytes,Ua LARGE (A) NEGATIVE   RBC / HPF >50 0 - 5 RBC/hpf   WBC, UA >50 0 - 5 WBC/hpf   Bacteria, UA FEW (A) NONE SEEN   Squamous Epithelial / HPF 0-5 0 - 5 /HPF   WBC Clumps PRESENT    Mucus PRESENT    Hyaline Casts, UA PRESENT     Comment: Performed at [facility], [address] [place], [place] [address]   No results found.  Pending Labs Unresulted Labs (From admission, onward)     Start     Ordered   02/26/23 0500  CBC  Tomorrow morning,   R        02/26/23 0101   02/26/23 0500  Basic metabolic panel  Tomorrow morning,   R        02/26/23 0101   02/26/23 0105  Magnesium  Once,   R        02/26/23 0104   02/26/23 0059  Urine Culture (for pregnant, neutropenic or urologic patients  or patients with an indwelling urinary catheter)  (Urine Labs)  Once,   URGENT       Question:  Indication  Answer:  Dysuria    02/26/23 0058   02/25/23 2210  CBC with Differential  Once,   STAT        02/25/23 2209            Vitals/Pain Today's Vitals   02/26/23 0000 02/26/23 0100 02/26/23 0119 02/26/23 0121  BP: 137/86   (!) 140/90  Pulse: 85   89  Resp: 18   18  Temp: 97.7 F (36.5 C)   97.7 F (36.5 C)  TempSrc: Oral   Oral  SpO2: 98%   100%  Weight:  144 lb 10 oz (65.6 kg)    Height:  5\' 3"  (1.6 m)    PainSc:   0-No pain 0-No pain    Isolation Precautions No active isolations  Medications Medications  enoxaparin (LOVENOX) injection 40 mg (has no administration in time range)  acetaminophen (TYLENOL) tablet 650 mg (has no administration in time range)    Or  acetaminophen (TYLENOL) suppository 650 mg (has no administration in time range)  ondansetron (ZOFRAN) tablet 4 mg (has no administration in time range)    Or  ondansetron (ZOFRAN) injection 4 mg (has no administration in time range)  ceFEPIme (MAXIPIME) 2 g in sodium chloride 0.9 % 100 mL IVPB (2 g Intravenous New Bag/Given 02/26/23 0122)  lactated ringers bolus 1,000 mL (1,000 mLs Intravenous New Bag/Given 02/25/23 2232)  potassium chloride SA (KLOR-CON M) CR tablet 40 mEq (40 mEq Oral Given 02/26/23 0124)    Mobility walks

## 2023-02-26 NOTE — Assessment & Plan Note (Addendum)
Syncope earlier today very likely due to patients orthostasis which is confirmed on ED vitals. Orthostasis in setting of acute illness (UTI). Hold losartan and hydrochlorothiazide IVF: 1L bolus in ED Treat UTI as above. Replace K Check Mg

## 2023-02-27 DIAGNOSIS — N3001 Acute cystitis with hematuria: Secondary | ICD-10-CM | POA: Diagnosis not present

## 2023-02-27 LAB — BASIC METABOLIC PANEL
Anion gap: 7 (ref 5–15)
BUN: 17 mg/dL (ref 8–23)
CO2: 20 mmol/L — ABNORMAL LOW (ref 22–32)
Calcium: 8.6 mg/dL — ABNORMAL LOW (ref 8.9–10.3)
Chloride: 113 mmol/L — ABNORMAL HIGH (ref 98–111)
Creatinine, Ser: 1.16 mg/dL — ABNORMAL HIGH (ref 0.44–1.00)
GFR, Estimated: 48 mL/min — ABNORMAL LOW (ref >60)
Glucose, Bld: 86 mg/dL (ref 70–99)
Potassium: 3.6 mmol/L (ref 3.5–5.1)
Sodium: 140 mmol/L (ref 135–145)

## 2023-02-27 LAB — MAGNESIUM: Magnesium: 2 mg/dL (ref 1.7–2.4)

## 2023-02-27 MED ORDER — POTASSIUM CHLORIDE CRYS ER 20 MEQ PO TBCR
40.0000 meq | EXTENDED_RELEASE_TABLET | Freq: Once | ORAL | Status: AC
  Administered 2023-02-27: 40 meq via ORAL
  Filled 2023-02-27: qty 2

## 2023-02-27 NOTE — Progress Notes (Signed)
PROGRESS NOTE    Leslie Patton  GEX:[id number] DOB: 10/16/1945 DOA: 02/25/2023 PCP: Gweneth Dimitri, MD     Brief Narrative:  AUDEAN KARPINSKI is a 77 year old female with past medical history significant for hypertension.  She presents with symptoms of nausea, diarrhea, hematuria and syncopal episode at home.  Apparently, she was not feeling well, got out of bed and fell in between her bed and nightstand.  EMS found that patient was orthostatic and had SVT.  She was diagnosed with UTI and syncope due to orthostatic hypotension and admitted to the hospital.   Additionally, she was in [facility] in November, and presented to [facility] with abdominal pain, right-side back pain, weakness, urinary frequency, nausea.  At that time, she was diagnosed with sepsis, AKI, obstructive uropathy, right kidney stone with hydronephrosis and underwent ureteral stent placement on 01/26/23.  She was supposed to have stent removed, and followed up with [facility], but saw a physician who does not remove stent.  She is still waiting to hear about getting another appointment.   New events last 24 hours / Subjective: Patient is feeling well overall, has no complaints today.  Orthostatic vital sign was negative.  Assessment & Plan:   Principal Problem:   UTI (urinary tract infection) Active Problems:   Ureteral stent present   Syncope due to orthostatic hypotension   UTI, POA -Urine culture showing gram-negative rod, reintubated for further growth -Empiric cefepime -Leukocytosis resolved  History of ureteral stone status post stent placement in November at an outside facility -Patient tells me that she is still waiting to hear back from urology for CT to confirm ureteral stent placement as x-ray did not reveal a stent. -Check renal CT today -Patient to call [facility] to follow-up on appointment.  I discussed with Dr. Sherron Monday via epic chat  yesterday  Hypertension -Cozaar  Syncope -In setting of UTI.  Orthostatic vital sign yesterday was negative   DVT prophylaxis:  enoxaparin (LOVENOX) injection 40 mg Start: 02/26/23 1000  Code Status: Full code Family Communication: Spouse at bedside Disposition Plan: Home Status is: Inpatient Remains inpatient appropriate because: IV antibiotics.  Urine culture remains pending    Antimicrobials:  Anti-infectives (From admission, onward)    Start     Dose/Rate Route Frequency Ordered Stop   02/26/23 2200  ceFEPIme (MAXIPIME) 2 g in sodium chloride 0.9 % 100 mL IVPB        2 g 200 mL/hr over 30 Minutes Intravenous Every 24 hours 02/26/23 0309     02/26/23 0115  ceFEPIme (MAXIPIME) 2 g in sodium chloride 0.9 % 100 mL IVPB        2 g 200 mL/hr over 30 Minutes Intravenous  Once 02/26/23 0109 02/26/23 0152        Objective: Vitals:   02/26/23 1742 02/26/23 2035 02/27/23 0552 02/27/23 0900  BP: 133/82 137/83 126/89   Pulse: 77 80 81   Resp: 16 18 16    Temp: 98 F (36.7 C) 99.2 F (37.3 C) 97.9 F (36.6 C)   TempSrc:  Oral Oral   SpO2: 96% 96% 95% 96%  Weight:      Height:        Intake/Output Summary (Last 24 hours) at 02/27/2023 1203 Last data filed at 02/27/2023 0954 Gross per 24 hour  Intake 1000 ml  Output 300 ml  Net 700 ml   Filed Weights   02/26/23 0100  Weight: 65.6 kg    Examination:  General exam: Appears calm and comfortable  Respiratory system: Clear to auscultation. Respiratory effort normal. No respiratory distress. No conversational dyspnea.  Cardiovascular system: S1 & S2 heard, RRR. No murmurs. No pedal edema. Gastrointestinal system: Abdomen is nondistended, soft and nontender. Normal bowel sounds heard. Central nervous system: Alert and oriented. No focal neurological deficits. Speech clear.  Extremities: Symmetric in appearance  Skin: No rashes, lesions or ulcers on exposed skin  Psychiatry: Judgement and insight appear normal. Mood  & affect appropriate.   Data Reviewed: I have personally reviewed following labs and imaging studies  CBC: Recent Labs  Lab 02/25/23 2340 02/26/23 0335  WBC 14.0* 10.3  NEUTROABS 11.2*  --   HGB 10.6* 9.6*  HCT 33.8* 30.5*  MCV 91.6 91.9  PLT 155 135*   Basic Metabolic Panel: Recent Labs  Lab 02/25/23 2231 02/26/23 0335 02/27/23 0519  NA 137 138 140  K 2.9* 3.3* 3.6  CL 113* 111 113*  CO2 17* 19* 20*  GLUCOSE 151* 121* 86  BUN 15 15 17   CREATININE 1.17* 0.78 1.16*  CALCIUM 8.6* 8.4* 8.6*  MG  --  1.9 2.0   GFR: Estimated Creatinine Clearance: 36.4 mL/min (A) (by C-G formula based on SCr of 1.16 mg/dL (H)). Liver Function Tests: Recent Labs  Lab 02/25/23 2231  AST 23  ALT 19  ALKPHOS 110  BILITOT 0.5  PROT 6.5  ALBUMIN 3.1*   No results for input(s): "LIPASE", "AMYLASE" in the last 168 hours. No results for input(s): "AMMONIA" in the last 168 hours. Coagulation Profile: No results for input(s): "INR", "PROTIME" in the last 168 hours. Cardiac Enzymes: No results for input(s): "CKTOTAL", "CKMB", "CKMBINDEX", "TROPONINI" in the last 168 hours. BNP (last 3 results) No results for input(s): "PROBNP" in the last 8760 hours. HbA1C: No results for input(s): "HGBA1C" in the last 72 hours. CBG: No results for input(s): "GLUCAP" in the last 168 hours. Lipid Profile: No results for input(s): "CHOL", "HDL", "LDLCALC", "TRIG", "CHOLHDL", "LDLDIRECT" in the last 72 hours. Thyroid Function Tests: No results for input(s): "TSH", "T4TOTAL", "FREET4", "T3FREE", "THYROIDAB" in the last 72 hours. Anemia Panel: No results for input(s): "VITAMINB12", "FOLATE", "FERRITIN", "TIBC", "IRON", "RETICCTPCT" in the last 72 hours. Sepsis Labs: No results for input(s): "PROCALCITON", "LATICACIDVEN" in the last 168 hours.  Recent Results (from the past 240 hours)  Urine Culture (for pregnant, neutropenic or urologic patients or patients with an indwelling urinary catheter)     Status:  Abnormal (Preliminary result)   Collection Time: 02/25/23 11:41 PM   Specimen: Urine, Clean Catch  Result Value Ref Range Status   Specimen Description   Final    URINE, CLEAN CATCH Performed at [facility], [address], [place], [place] [address]    Special Requests   Final    NONE Performed at [facility], [address] [place], [place] [address]    Culture (A)  Final    80,000 COLONIES/mL GRAM NEGATIVE RODS CULTURE REINCUBATED FOR BETTER GROWTH Performed at [facility], [address], [place], [place] [address]    Report Status PENDING  Incomplete      Radiology Studies: No results found.    Scheduled Meds:  donepezil  5 mg Oral QHS   DULoxetine  60 mg Oral Daily   enoxaparin (LOVENOX) injection  40 mg Subcutaneous Q24H   fluvoxaMINE  100 mg Oral QHS   losartan  25 mg Oral Daily   OLANZapine  2.5 mg Oral  QHS   Continuous Infusions:  ceFEPime (MAXIPIME) IV 2 g (02/26/23 2047)     LOS: 1 day   Time spent: 25 minutes   [name], DO [facility] 02/27/2023, 12:03 PM   Available via Epic secure chat 7am-7pm After these hours, please refer to coverage provider listed on [email or web address]

## 2023-02-27 NOTE — Plan of Care (Signed)
  Problem: Education: Goal: Knowledge of General Education information will improve Description Including pain rating scale, medication(s)/side effects and non-pharmacologic comfort measures Outcome: Progressing   Problem: Health Behavior/Discharge Planning: Goal: Ability to manage health-related needs will improve Outcome: Progressing   

## 2023-02-27 NOTE — Plan of Care (Signed)
?  Problem: Clinical Measurements: ?Goal: Will remain free from infection ?Outcome: Progressing ?  ?

## 2023-02-27 NOTE — Progress Notes (Signed)
Mobility Specialist - Progress Note   02/27/23 0935  Mobility  Activity Ambulated independently in hallway  Level of Assistance Independent  Assistive Device None  Distance Ambulated (ft) 600 ft  Activity Response Tolerated well  Mobility Referral Yes  Mobility visit 1 Mobility  Mobility Specialist Start Time (ACUTE ONLY) 0925  Mobility Specialist Stop Time (ACUTE ONLY) 0934  Mobility Specialist Time Calculation (min) (ACUTE ONLY) 9 min   Pt received in bed and agreeable to mobility. No complaints during session. Pt to bathroom after session with all needs met. RN in room.     [facility]

## 2023-02-28 ENCOUNTER — Inpatient Hospital Stay (HOSPITAL_COMMUNITY): Payer: Medicare Other

## 2023-02-28 DIAGNOSIS — N3001 Acute cystitis with hematuria: Secondary | ICD-10-CM | POA: Diagnosis not present

## 2023-02-28 LAB — BASIC METABOLIC PANEL
Anion gap: 6 (ref 5–15)
BUN: 18 mg/dL (ref 8–23)
CO2: 21 mmol/L — ABNORMAL LOW (ref 22–32)
Calcium: 8.6 mg/dL — ABNORMAL LOW (ref 8.9–10.3)
Chloride: 110 mmol/L (ref 98–111)
Creatinine, Ser: 0.99 mg/dL (ref 0.44–1.00)
GFR, Estimated: 58 mL/min — ABNORMAL LOW (ref >60)
Glucose, Bld: 96 mg/dL (ref 70–99)
Potassium: 3.7 mmol/L (ref 3.5–5.1)
Sodium: 137 mmol/L (ref 135–145)

## 2023-02-28 LAB — URINE CULTURE: Culture: 80000 — AB

## 2023-02-28 MED ORDER — CEPHALEXIN 500 MG PO CAPS: 500.0000 mg | ORAL_CAPSULE | Freq: Two times a day (BID) | ORAL | 0 refills | Status: AC

## 2023-02-28 MED ORDER — CEPHALEXIN 500 MG PO CAPS
500.0000 mg | ORAL_CAPSULE | Freq: Two times a day (BID) | ORAL | Status: DC
  Administered 2023-02-28: 500 mg via ORAL
  Filled 2023-02-28: qty 1

## 2023-02-28 NOTE — Plan of Care (Signed)

## 2023-02-28 NOTE — Discharge Summary (Signed)
Physician Discharge Summary  HERTA LEUENBERGER MVH:[id number] DOB: 10/02/1945 DOA: 02/25/2023  PCP: Gweneth Dimitri, MD  Admit date: 02/25/2023 Discharge date: 02/28/2023  Admitted From: Home Disposition:  Home  Recommendations for Outpatient Follow-up:  Follow up with PCP in 1 week Follow [facility] with [facility] for ureteral stent removal  Discharge Condition: Stable CODE STATUS: Full code Diet recommendation: Regular diet  Brief/Interim Summary: MAZEY Patton is a 77 year old female with past medical history significant for hypertension.  She presents with symptoms of nausea, diarrhea, hematuria and syncopal episode at home.  Apparently, she was not feeling well, got out of bed and fell in between her bed and nightstand.  EMS found that patient was orthostatic and had SVT.  She was diagnosed with UTI and syncope due to orthostatic hypotension and admitted to the hospital.   Additionally, she was in [facility] in November, and presented to [facility] with abdominal pain, right-side back pain, weakness, urinary frequency, nausea.  At that time, she was diagnosed with sepsis, AKI, obstructive uropathy, right kidney stone with hydronephrosis and underwent ureteral stent placement on 01/26/23.  She was supposed to have stent removed, and followed up with [facility], but saw a physician who does not remove stent.  She is still waiting to hear about getting another appointment.   Patient was diagnosed with UTI and started on cefepime.  Urine culture revealed E. coli and Proteus.  Cefepime was de-escalated to Keflex for discharge.  Renal CT was completed during hospitalization.  Patient to follow-up with urology outpatient for stent removal.  Discharge Diagnoses:   Principal Problem:   UTI (urinary tract infection) Active Problems:   Ureteral stent present   Syncope due to orthostatic hypotension   UTI, POA -Urine culture showing E. coli and Proteus  mirabilis -Empiric cefepime --> Keflex  -Leukocytosis resolved   History of ureteral stone status post stent placement in November at an outside facility -Patient tells me that she is still waiting to hear back from urology for CT to confirm ureteral stent placement as x-ray did not reveal a stent. -Renal CT revealed nonobstructive right renal calculus, right-sided ureteral stent  in good position -Patient to call [facility] to follow-up on appointment.  I discussed with Dr. Sherron Monday via epic chat    Hypertension -Cozaar   Syncope -In setting of UTI.  Orthostatic vital sign was negative  Discharge Instructions  Discharge Instructions     Call MD for:  difficulty breathing, headache or visual disturbances   Complete by: As directed    Call MD for:  extreme fatigue   Complete by: As directed    Call MD for:  hives   Complete by: As directed    Call MD for:  persistant dizziness or light-headedness   Complete by: As directed    Call MD for:  persistant nausea and vomiting   Complete by: As directed    Call MD for:  severe uncontrolled pain   Complete by: As directed    Call MD for:  temperature >100.4   Complete by: As directed    Diet general   Complete by: As directed    Discharge instructions   Complete by: As directed    You were cared for by a hospitalist during your hospital stay. If you have any questions about your discharge medications or the care you received while you were in the hospital after you are discharged, you can call the unit and ask to speak  with the hospitalist on call if the hospitalist that took care of you is not available. Once you are discharged, your primary care physician will handle any further medical issues. Please note that NO REFILLS for any discharge medications will be authorized once you are discharged, as it is imperative that you return to your primary care physician (or establish a relationship with a primary care physician if you do  not have one) for your aftercare needs so that they can reassess your need for medications and monitor your lab values.   Increase activity slowly   Complete by: As directed       Allergies as of 02/28/2023       Reactions   Penicillins Hives        Medication List     TAKE these medications    cephALEXin 500 MG capsule Commonly known as: KEFLEX Take 1 capsule (500 mg total) by mouth every 12 (twelve) hours for 4 days.   donepezil 5 MG tablet Commonly known as: ARICEPT Take 5 mg by mouth at bedtime.   DULoxetine 60 MG capsule Commonly known as: CYMBALTA Take 60 mg by mouth daily.   fluvoxaMINE 100 MG tablet Commonly known as: LUVOX Take 100 mg by mouth at bedtime.   losartan 25 MG tablet Commonly known as: COZAAR Take 25 mg by mouth daily.   OLANZapine 2.5 MG tablet Commonly known as: ZYPREXA Take 2.5 mg by mouth at bedtime.        Follow-up Information     Gweneth Dimitri, MD Follow up.   Specialty: Family Medicine Contact information: [address] [place] [address] [phone]         [facility]. Call on 03/01/2023.   Contact information: [address] [place] [address] [phone]               Allergies  Allergen Reactions   Penicillins Hives    Consultations: None    Procedures/Studies: CT RENAL STONE STUDY Result Date: 02/28/2023 CLINICAL DATA:  Nephrolithiasis. EXAM: CT ABDOMEN AND PELVIS WITHOUT CONTRAST TECHNIQUE: Multidetector CT imaging of the abdomen and pelvis was performed following the standard protocol without IV contrast. RADIATION DOSE REDUCTION: This exam was performed according to the departmental dose-optimization program which includes automated exposure control, adjustment of the mA and/or kV according to patient size and/or use of iterative reconstruction technique. COMPARISON:  March 26, 2017. FINDINGS: Lower chest: Large hiatal hernia is noted. Visualized  lung bases are unremarkable. Hepatobiliary: No focal liver abnormality is seen. Status post cholecystectomy. No biliary dilatation. Pancreas: Unremarkable. No pancreatic ductal dilatation or surrounding inflammatory changes. Spleen: Normal in size without focal abnormality. Adrenals/Urinary Tract: Adrenal glands appear normal. Small nonobstructive calculus seen in lower pole collecting system of right kidney. No hydronephrosis. Right renal cyst is noted for which no further follow-up is required. Right-sided ureteral stent is noted in grossly good position. Urinary bladder is unremarkable. Stomach/Bowel: There is no evidence of bowel obstruction or inflammation. Sigmoid diverticulosis without inflammation. Vascular/Lymphatic: No significant vascular findings are present. No enlarged abdominal or pelvic lymph nodes. Reproductive: Uterus and bilateral adnexa are unremarkable. Other: No abdominal wall hernia or abnormality. No abdominopelvic ascites. Musculoskeletal: Old L2 compression fracture is noted. No acute osseous abnormality. IMPRESSION: Large sliding-type hiatal hernia. Nonobstructive right renal calculus. Right-sided ureteral stent is noted in grossly good position. No hydronephrosis. Sigmoid diverticulosis without inflammation. Old L2 compression fracture. Electronically Signed   By: Zenda Alpers.D.  On: 02/28/2023 13:30       Discharge Exam: Vitals:   02/27/23 2157 02/28/23 0620  BP: 120/77 124/81  Pulse: 86 79  Resp: 18 16  Temp: 98.3 F (36.8 C) 97.6 F (36.4 C)  SpO2: 97% 97%    General: Pt is alert, awake, not in acute distress Cardiovascular: RRR, S1/S2 +, no edema Respiratory: CTA bilaterally, no wheezing, no rhonchi, no respiratory distress, no conversational dyspnea  Abdominal: Soft, NT, ND, bowel sounds + Extremities: no edema, no cyanosis Psych: Normal mood and affect, stable judgement and insight     The results of significant diagnostics from this hospitalization  (including imaging, microbiology, ancillary and laboratory) are listed below for reference.     Microbiology: Recent Results (from the past 240 hours)  Urine Culture (for pregnant, neutropenic or urologic patients or patients with an indwelling urinary catheter)     Status: Abnormal   Collection Time: 02/25/23 11:41 PM   Specimen: Urine, Clean Catch  Result Value Ref Range Status   Specimen Description   Final    URINE, CLEAN CATCH Performed at [facility], [address], [place], [place] [address]    Special Requests   Final    NONE Performed at [facility], [address] [place], [place] [address]    Culture (A)  Final    80,000 COLONIES/mL ESCHERICHIA COLI >=100,000 COLONIES/mL PROTEUS MIRABILIS    Report Status 02/28/2023 FINAL  Final   Organism ID, Bacteria ESCHERICHIA COLI (A)  Final   Organism ID, Bacteria PROTEUS MIRABILIS (A)  Final      Susceptibility   Escherichia coli - MIC*    AMPICILLIN 4 SENSITIVE Sensitive     CEFAZOLIN <=4 SENSITIVE Sensitive     CEFEPIME <=0.12 SENSITIVE Sensitive     CEFTRIAXONE <=0.25 SENSITIVE Sensitive     CIPROFLOXACIN <=0.25 SENSITIVE Sensitive     GENTAMICIN <=1 SENSITIVE Sensitive     IMIPENEM <=0.25 SENSITIVE Sensitive     NITROFURANTOIN <=16 SENSITIVE Sensitive     TRIMETH/SULFA >=320 RESISTANT Resistant     AMPICILLIN/SULBACTAM <=2 SENSITIVE Sensitive     PIP/TAZO <=4 SENSITIVE Sensitive ug/mL    * 80,000 COLONIES/mL ESCHERICHIA COLI   Proteus mirabilis - MIC*    AMPICILLIN <=2 SENSITIVE Sensitive     CEFAZOLIN 8 SENSITIVE Sensitive     CEFEPIME <=0.12 SENSITIVE Sensitive     CEFTRIAXONE <=0.25 SENSITIVE Sensitive     CIPROFLOXACIN <=0.25 SENSITIVE Sensitive     GENTAMICIN <=1 SENSITIVE Sensitive     IMIPENEM 2 SENSITIVE Sensitive     NITROFURANTOIN RESISTANT Resistant     TRIMETH/SULFA <=20 SENSITIVE Sensitive     AMPICILLIN/SULBACTAM <=2 SENSITIVE Sensitive     PIP/TAZO <=4  SENSITIVE Sensitive ug/mL    * >=100,000 COLONIES/mL PROTEUS MIRABILIS     Labs: BNP (last 3 results) No results for input(s): "BNP" in the last 8760 hours. Basic Metabolic Panel: Recent Labs  Lab 02/25/23 2231 02/26/23 0335 02/27/23 0519 02/28/23 0432  NA 137 138 140 137  K 2.9* 3.3* 3.6 3.7  CL 113* 111 113* 110  CO2 17* 19* 20* 21*  GLUCOSE 151* 121* 86 96  BUN 15 15 17 18   CREATININE 1.17* 0.78 1.16* 0.99  CALCIUM 8.6* 8.4* 8.6* 8.6*  MG  --  1.9 2.0  --    Liver Function Tests: Recent Labs  Lab 02/25/23 2231  AST 23  ALT 19  ALKPHOS 110  BILITOT 0.5  PROT 6.5  ALBUMIN 3.1*   No results for input(s): "LIPASE", "AMYLASE" in the last 168 hours. No results for input(s): "AMMONIA" in the last 168 hours. CBC: Recent Labs  Lab 02/25/23 2340 02/26/23 0335  WBC 14.0* 10.3  NEUTROABS 11.2*  --   HGB 10.6* 9.6*  HCT 33.8* 30.5*  MCV 91.6 91.9  PLT 155 135*   Cardiac Enzymes: No results for input(s): "CKTOTAL", "CKMB", "CKMBINDEX", "TROPONINI" in the last 168 hours. BNP: Invalid input(s): "POCBNP" CBG: No results for input(s): "GLUCAP" in the last 168 hours. D-Dimer No results for input(s): "DDIMER" in the last 72 hours. Hgb A1c No results for input(s): "HGBA1C" in the last 72 hours. Lipid Profile No results for input(s): "CHOL", "HDL", "LDLCALC", "TRIG", "CHOLHDL", "LDLDIRECT" in the last 72 hours. Thyroid function studies No results for input(s): "TSH", "T4TOTAL", "T3FREE", "THYROIDAB" in the last 72 hours.  Invalid input(s): "FREET3" Anemia work up No results for input(s): "VITAMINB12", "FOLATE", "FERRITIN", "TIBC", "IRON", "RETICCTPCT" in the last 72 hours. Urinalysis    Component Value Date/Time   COLORURINE YELLOW 02/25/2023 2341   APPEARANCEUR CLOUDY (A) 02/25/2023 2341   LABSPEC 1.006 02/25/2023 2341   PHURINE 5.0 02/25/2023 2341   GLUCOSEU NEGATIVE 02/25/2023 2341   HGBUR LARGE (A) 02/25/2023 2341   BILIRUBINUR NEGATIVE 02/25/2023 2341    KETONESUR NEGATIVE 02/25/2023 2341   PROTEINUR 100 (A) 02/25/2023 2341   UROBILINOGEN 0.2 08/23/2012 1015   NITRITE NEGATIVE 02/25/2023 2341   LEUKOCYTESUR LARGE (A) 02/25/2023 2341   Sepsis Labs Recent Labs  Lab 02/25/23 2340 02/26/23 0335  WBC 14.0* 10.3   Microbiology Recent Results (from the past 240 hours)  Urine Culture (for pregnant, neutropenic or urologic patients or patients with an indwelling urinary catheter)     Status: Abnormal   Collection Time: 02/25/23 11:41 PM   Specimen: Urine, Clean Catch  Result Value Ref Range Status   Specimen Description   Final    URINE, CLEAN CATCH Performed at [facility], [address], [place], [place] [address]    Special Requests   Final    NONE Performed at [facility], [address], [place], [place] [address]    Culture (A)  Final    80,000 COLONIES/mL ESCHERICHIA COLI >=100,000 COLONIES/mL PROTEUS MIRABILIS    Report Status 02/28/2023 FINAL  Final   Organism ID, Bacteria ESCHERICHIA COLI (A)  Final   Organism ID, Bacteria PROTEUS MIRABILIS (A)  Final      Susceptibility   Escherichia coli - MIC*    AMPICILLIN 4 SENSITIVE Sensitive     CEFAZOLIN <=4 SENSITIVE Sensitive     CEFEPIME <=0.12 SENSITIVE Sensitive     CEFTRIAXONE <=0.25 SENSITIVE Sensitive     CIPROFLOXACIN <=0.25 SENSITIVE Sensitive     GENTAMICIN <=1 SENSITIVE Sensitive     IMIPENEM <=0.25 SENSITIVE Sensitive     NITROFURANTOIN <=16 SENSITIVE Sensitive     TRIMETH/SULFA >=320 RESISTANT Resistant     AMPICILLIN/SULBACTAM <=2 SENSITIVE Sensitive     PIP/TAZO <=4 SENSITIVE Sensitive ug/mL    * 80,000 COLONIES/mL ESCHERICHIA COLI   Proteus mirabilis - MIC*    AMPICILLIN <=2 SENSITIVE Sensitive     CEFAZOLIN 8 SENSITIVE Sensitive     CEFEPIME <=0.12 SENSITIVE Sensitive     CEFTRIAXONE <=0.25 SENSITIVE Sensitive     CIPROFLOXACIN <=0.25 SENSITIVE Sensitive     GENTAMICIN <=1 SENSITIVE Sensitive     IMIPENEM 2  SENSITIVE Sensitive     NITROFURANTOIN RESISTANT Resistant  TRIMETH/SULFA <=20 SENSITIVE Sensitive     AMPICILLIN/SULBACTAM <=2 SENSITIVE Sensitive     PIP/TAZO <=4 SENSITIVE Sensitive ug/mL    * >=100,000 COLONIES/mL PROTEUS MIRABILIS     Patient was seen and examined on the day of discharge and was found to be in stable condition. Time coordinating discharge: 25 minutes including assessment and coordination of care, as well as examination of the patient.   SIGNED:  [name], DO [facility] 02/28/2023, 1:34 PM

## 2023-02-28 NOTE — Progress Notes (Signed)
Reviewed written d/c instructions w pt and all questions answered. She verbalized understanding. D/C via w/c w all belongings in stable condition. 

## 2023-03-04 ENCOUNTER — Other Ambulatory Visit: Payer: Self-pay | Admitting: Urology

## 2023-03-05 NOTE — Progress Notes (Addendum)
 COVID Vaccine received:  []  No [x]  Yes Date of any COVID positive Test in last 90 days: No PCP - Sari Pay MD Cardiologist -   Chest x-ray -  EKG -  02/26/23 Epic Stress Test -  ECHO -  Cardiac Cath -   Bowel Prep - [x]  No  []   Yes ______  Pacemaker / ICD device [x]  No []  Yes   Spinal Cord Stimulator:[x]  No []  Yes       History of Sleep Apnea? [x]  No []  Yes   CPAP used?- [x]  No []  Yes    Does the patient monitor blood sugar?          [x]  No []  Yes  []  N/A  Patient has: [x]  NO Hx DM   []  Pre-DM                 []  DM1  []   DM2 Does patient have a [employer] or [employer]? []  No []  Yes   Fasting Blood Sugar Ranges-  Checks Blood Sugar _____ times a day  GLP1 agonist / usual dose - no GLP1 instructions:  SGLT-2 inhibitors / usual dose - no SGLT-2 instructions:   Blood Thinner / Instructions:no Aspirin Instructions:no  Comments:   Activity level: Patient is able to climb a flight of stairs without difficulty; [x]  No CP  [x]  No SOB, ___   Patient can / perform ADLs without assistance.   Anesthesia review: HTN, hospitalized 12/26-12/29  Patient denies shortness of breath, fever, cough and chest pain at PAT appointment.  Patient verbalized understanding and agreement to the Pre-Surgical Instructions that were given to them at this PAT appointment. Patient was also educated of the need to review these PAT instructions again prior to his/her surgery.I reviewed the appropriate phone numbers to call if they have any and questions or concerns.

## 2023-03-05 NOTE — Patient Instructions (Addendum)
 SURGICAL WAITING ROOM VISITATION  Patients having surgery or a procedure may have no more than 2 support people in the waiting area - these visitors may rotate.    Children under the age of 65 must have an adult with them who is not the patient.  Due to an increase in RSV and influenza rates and associated hospitalizations, children ages 84 and under may not visit patients in [facility] hospitals.  If the patient needs to stay at the hospital during part of their recovery, the visitor guidelines for inpatient rooms apply. Pre-op nurse will coordinate an appropriate time for 1 support person to accompany patient in pre-op.  This support person may not rotate.    Please refer to the [facility] website for the visitor guidelines for Inpatients (after your surgery is over and you are in a regular room).       Your procedure is scheduled on: 03/09/23   Report to [facility] Main Entrance    Report to admitting at  9:15 AM   Call this number if you have problems the morning of surgery [phone]   Do not eat food or drink liquids :After Midnight.      Oral Hygiene is also important to reduce your risk of infection.                                    Remember - BRUSH YOUR TEETH THE MORNING OF SURGERY WITH YOUR REGULAR TOOTHPASTE   Stop all vitamins and herbal supplements 7 days before surgery.   Take these medicines the morning of surgery with A SIP OF WATER: Cymbalta   Bring CPAP mask and tubing day of surgery.                              You may not have any metal on your body including hair pins, jewelry, and body piercing             Do not wear make-up, lotions, powders, perfumes/cologne, or deodorant  Do not wear nail polish including gel and S&S, artificial/acrylic nails, or any other type of covering on natural nails including finger and toenails. If you have artificial nails, gel coating, etc. that needs to be removed by a nail salon please have this removed prior  to surgery or surgery may need to be canceled/ delayed if the surgeon/ anesthesia feels like they are unable to be safely monitored.   Do not shave  48 hours prior to surgery.    Do not bring valuables to the hospital. [facility] IS NOT             RESPONSIBLE   FOR VALUABLES.   Contacts, glasses, dentures or bridgework may not be worn into surgery.  DO NOT BRING YOUR HOME MEDICATIONS TO THE HOSPITAL. PHARMACY WILL DISPENSE MEDICATIONS LISTED ON YOUR MEDICATION LIST TO YOU DURING YOUR ADMISSION IN THE HOSPITAL!    Patients discharged on the day of surgery will not be allowed to drive home.  Someone NEEDS to stay with you for the first 24 hours after anesthesia.   Special Instructions: Bring a copy of your healthcare power of attorney and living will documents the day of surgery if you haven't scanned them before.              Please read over the following fact sheets you  were given: IF YOU HAVE QUESTIONS ABOUT YOUR PRE-OP INSTRUCTIONS PLEASE CALL [phone] [facility]   If you received a COVID test during your pre-op visit  it is requested that you wear a mask when out in public, stay away from anyone that may not be feeling well and notify your surgeon if you develop symptoms. If you test positive for Covid or have been in contact with anyone that has tested positive in the last 10 days please notify you surgeon.    [facility] - Preparing for Surgery Before surgery, you can play an important role.  Because skin is not sterile, your skin needs to be as free of germs as possible.  You can reduce the number of germs on your skin by washing with CHG (chlorahexidine gluconate) soap before surgery.  CHG is an antiseptic cleaner which kills germs and bonds with the skin to continue killing germs even after washing. Please DO NOT use if you have an allergy to CHG or antibacterial soaps.  If your skin becomes reddened/irritated stop using the CHG and inform your nurse when you arrive at Short  Stay. Do not shave (including legs and underarms) for at least 48 hours prior to the first CHG shower.  You may shave your face/neck.  Please follow these instructions carefully:  1.  Shower with CHG Soap the night before surgery and the  morning of surgery.  2.  If you choose to wash your hair, wash your hair first as usual with your normal  shampoo.  3.  After you shampoo, rinse your hair and body thoroughly to remove the shampoo.                             4.  Use CHG as you would any other liquid soap.  You can apply chg directly to the skin and wash.  Gently with a scrungie or clean washcloth.  5.  Apply the CHG Soap to your body ONLY FROM THE NECK DOWN.   Do   not use on face/ open                           Wound or open sores. Avoid contact with eyes, ears mouth and   genitals (private parts).                       Wash face,  Genitals (private parts) with your normal soap.             6.  Wash thoroughly, paying special attention to the area where your    surgery  will be performed.  7.  Thoroughly rinse your body with warm water from the neck down.  8.  DO NOT shower/wash with your normal soap after using and rinsing off the CHG Soap.                9.  Pat yourself dry with a clean towel.            10.  Wear clean pajamas.            11.  Place clean sheets on your bed the night of your first shower and do not  sleep with pets. Day of Surgery : Do not apply any lotions/deodorants the morning of surgery.  Please wear clean clothes to the hospital/surgery center.  FAILURE TO FOLLOW THESE INSTRUCTIONS MAY RESULT  IN THE CANCELLATION OF YOUR SURGERY  PATIENT SIGNATURE_________________________________  NURSE SIGNATURE__________________________________  ________________________________________________________________________

## 2023-03-08 ENCOUNTER — Encounter (HOSPITAL_COMMUNITY)
Admission: RE | Admit: 2023-03-08 | Discharge: 2023-03-08 | Disposition: A | Discharge status: Home / Self Care | Payer: Medicare Other | Source: Ambulatory Visit | Attending: Urology | Admitting: Urology

## 2023-03-08 ENCOUNTER — Encounter (HOSPITAL_COMMUNITY): Payer: Self-pay

## 2023-03-08 ENCOUNTER — Other Ambulatory Visit: Payer: Self-pay

## 2023-03-08 VITALS — BP 137/96 | HR 84 | Temp 98.3°F | Resp 16 | Ht 64.0 in | Wt 130.0 lb

## 2023-03-08 DIAGNOSIS — Z01818 Encounter for other preprocedural examination: Secondary | ICD-10-CM | POA: Diagnosis not present

## 2023-03-08 HISTORY — DX: Unspecified osteoarthritis, unspecified site: M19.90

## 2023-03-08 HISTORY — DX: Personal history of urinary calculi: Z87.442

## 2023-03-08 LAB — CBC
HCT: 37.2 % (ref 36.0–46.0)
Hemoglobin: 11.6 g/dL — ABNORMAL LOW (ref 12.0–15.0)
MCH: 28.6 pg (ref 26.0–34.0)
MCHC: 31.2 g/dL (ref 30.0–36.0)
MCV: 91.6 fL (ref 80.0–100.0)
Platelets: 306 10*3/uL (ref 150–400)
RBC: 4.06 MIL/uL (ref 3.87–5.11)
RDW: 15.9 % — ABNORMAL HIGH (ref 11.5–15.5)
WBC: 9.1 10*3/uL (ref 4.0–10.5)
nRBC: 0 % (ref 0.0–0.2)

## 2023-03-08 NOTE — H&P (Signed)
 78 year old female with history of obstructive stone pyelonephritis treated in [place]  patient had a stent placed at that time did not have stent removed. Patient had repeat CT scan to see if there was residual stone which does not appear to be any. CT shows R stent in place and R lower pole stone.   PMH:  Card: denies issues  Pulm; no pulm issues  Blood thinners: none   Right ureteral stone:  03/02/2023 stent in place no stone easily visualized on CT. Stent was placed tuesday before thanksgiving. Patient having urgency with the stent. Patient is tired of the stent. No F/C/N/V.   03/08/22: Patient here today for R ureteral stent removal and URS w/ LL    ALLERGIES: Penicillin - Skin Rash    MEDICATIONS: Macrodantin 100 mg capsule 1 capsule PO BID  Calcium   Donepezil  Hcl 10 mg tablet 1 tablet PO Daily  Duloxetine  Hcl 60 mg capsule,delayed release 1 capsule PO Daily  Fluvoxamine  Maleate 100 mg tablet 1 tablet PO Daily  Losartan  Potassium 25 mg tablet 1 tablet PO Daily  Multivitamin  Olanzapine  2.5 mg tablet 1 tablet PO Daily  Vitamin D3  Vitramon 65Mg      GU PSH: No GU PSH     NON-GU PSH: Remove Gallbladder - 2022           GU PMH: Ureteral calculus - 02/17/2023, - 02/15/2023 Urinary Tract Inf, Unspec site - 02/17/2023, - 02/15/2023 Hydronephrosis - 02/15/2023     NON-GU PMH: Hypertension     FAMILY HISTORY: 2 daughters - Daughter Cancer - Mother Deceased - Father, Mother heart - Father    SOCIAL HISTORY: Marital Status: Married Preferred Language: English; Ethnicity: Not Hispanic Or Latino; Race: White Current Smoking Status: Patient does not smoke anymore. Has not smoked since 01/30/1998. Smoked for 10 years. Smoked 1 pack per day.  <DIV'  Tobacco Use Assessment Completed:  Used Tobacco in last 30 days?   Does not use smokeless tobacco. Has never drank.  Drinks 2 caffeinated drinks per day. Patient's occupation is/was retired.     REVIEW OF SYSTEMS:     GU  Review Female:  Patient reports frequent urination and get up at night to urinate. Patient denies hard to postpone urination, burning /pain with urination, leakage of urine, stream starts and stops, trouble starting your stream, have to strain to urinate, and being pregnant.    Gastrointestinal (Upper):  Patient denies nausea, vomiting, and indigestion/ heartburn.    Gastrointestinal (Lower):  Patient denies diarrhea and constipation.    Constitutional:  Patient denies fever, night sweats, weight loss, and fatigue.    Skin:  Patient denies skin rash/ lesion and itching.    Eyes:  Patient denies blurred vision and double vision.    Ears/ Nose/ Throat:  Patient denies sore throat and sinus problems.    Hematologic/Lymphatic:  Patient denies swollen glands and easy bruising.    Cardiovascular:  Patient denies leg swelling and chest pains.    Respiratory:  Patient denies cough and shortness of breath.    Endocrine:  Patient denies excessive thirst.    Musculoskeletal:  Patient denies back pain and joint pain.    Neurological:  Patient denies headaches and dizziness.    Psychologic:  Patient denies depression and anxiety.    VITAL SIGNS:       03/02/2023 08:41 AM     Weight 142 lb / 64.41 kg     Height 64.5 in / 163.83 cm  BP 135/86 mmHg     Pulse 87 /min     Temperature 97.3 F / 36.2 C     BMI 24.0 kg/m     MULTI-SYSTEM PHYSICAL EXAMINATION:      Constitutional: Well-nourished. No physical deformities. Normally developed. Good grooming.     Respiratory: No labored breathing, no use of accessory muscles.      Cardiovascular: Normal temperature, normal extremity pulses, no swelling, no varicosities.            Complexity of Data:   Records Review:  Previous Patient Records  Urine Test Review:  Urinalysis  X-Ray Review: C.T. Abdomen/Pelvis: Reviewed Films. Reviewed Report. Right stent in place, no stones noted in the ureter but this can be obstructed due to the stent. There is a right  lower pole stone.    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 Urine culture today ordered.   R RUS w/ LL   We discussed the risk benefits and alternatives to ureteroscopy. This includes bleeding, infection, damage to surrounding structures including the urethra, bladder, ureter, and kidney. With these possible injuries resulting in need for intervention in the future. We discussed inability to remove all the stone and requiring follow-up ureteroscopy. We also discussed the possibility of not being able to gain access to the kidney and the need for nephrostomy tube. Possibility of long-term stent was also discussed. The patient voiced their understanding and would like to proceed.   Proceed with R URS w/ LL and possible stent replacement

## 2023-03-09 ENCOUNTER — Ambulatory Visit (HOSPITAL_COMMUNITY): Payer: Medicare Other | Admitting: Physician Assistant

## 2023-03-09 ENCOUNTER — Ambulatory Visit (HOSPITAL_COMMUNITY): Payer: Medicare Other

## 2023-03-09 ENCOUNTER — Encounter (HOSPITAL_COMMUNITY)
Admission: RE | Disposition: A | Discharge status: Home / Self Care | Payer: Self-pay | Source: Home / Self Care | Attending: Urology

## 2023-03-09 ENCOUNTER — Encounter (HOSPITAL_COMMUNITY): Payer: Self-pay | Admitting: Urology

## 2023-03-09 ENCOUNTER — Ambulatory Visit (HOSPITAL_COMMUNITY)
Admission: RE | Admit: 2023-03-09 | Discharge: 2023-03-09 | Disposition: A | Discharge status: Home / Self Care | Payer: Medicare Other | Attending: Urology | Admitting: Urology

## 2023-03-09 ENCOUNTER — Ambulatory Visit (HOSPITAL_BASED_OUTPATIENT_CLINIC_OR_DEPARTMENT_OTHER): Payer: Medicare Other | Admitting: Anesthesiology

## 2023-03-09 DIAGNOSIS — I10 Essential (primary) hypertension: Secondary | ICD-10-CM | POA: Diagnosis not present

## 2023-03-09 DIAGNOSIS — N201 Calculus of ureter: Secondary | ICD-10-CM | POA: Insufficient documentation

## 2023-03-09 DIAGNOSIS — F17210 Nicotine dependence, cigarettes, uncomplicated: Secondary | ICD-10-CM | POA: Diagnosis not present

## 2023-03-09 DIAGNOSIS — G4733 Obstructive sleep apnea (adult) (pediatric): Secondary | ICD-10-CM | POA: Diagnosis not present

## 2023-03-09 HISTORY — PX: CYSTOSCOPY/URETEROSCOPY/HOLMIUM LASER/STENT PLACEMENT: SHX6546

## 2023-03-09 SURGERY — CYSTOSCOPY/URETEROSCOPY/HOLMIUM LASER/STENT PLACEMENT
Anesthesia: General | Laterality: Right

## 2023-03-09 MED ORDER — HYOSCYAMINE SULFATE 0.125 MG PO TBDP: 0.1250 mg | ORAL_TABLET | Freq: Four times a day (QID) | ORAL | 0 refills | Status: AC | PRN

## 2023-03-09 MED ORDER — SODIUM CHLORIDE 0.9 % IR SOLN
Status: DC | PRN
  Administered 2023-03-09: 3000 mL via INTRAVESICAL

## 2023-03-09 MED ORDER — ONDANSETRON HCL 4 MG/2ML IJ SOLN
INTRAMUSCULAR | Status: AC
  Filled 2023-03-09: qty 2

## 2023-03-09 MED ORDER — ACETAMINOPHEN 500 MG PO TABS
1000.0000 mg | ORAL_TABLET | Freq: Once | ORAL | Status: AC
Start: 2023-03-09 — End: 2023-03-09
  Administered 2023-03-09: 1000 mg via ORAL
  Filled 2023-03-09: qty 2

## 2023-03-09 MED ORDER — ORAL CARE MOUTH RINSE: 15.0000 mL | Freq: Once | OROMUCOSAL | Status: AC

## 2023-03-09 MED ORDER — LIDOCAINE HCL (CARDIAC) PF 100 MG/5ML IV SOSY
PREFILLED_SYRINGE | INTRAVENOUS | Status: DC | PRN
  Administered 2023-03-09: 60 mg via INTRAVENOUS

## 2023-03-09 MED ORDER — TAMSULOSIN HCL 0.4 MG PO CAPS: 0.4000 mg | ORAL_CAPSULE | Freq: Every day | ORAL | 0 refills | Status: AC

## 2023-03-09 MED ORDER — PROPOFOL 10 MG/ML IV BOLUS
INTRAVENOUS | Status: DC | PRN
  Administered 2023-03-09: 200 mg via INTRAVENOUS

## 2023-03-09 MED ORDER — CHLORHEXIDINE GLUCONATE 0.12 % MT SOLN
15.0000 mL | Freq: Once | OROMUCOSAL | Status: AC
  Administered 2023-03-09: 15 mL via OROMUCOSAL

## 2023-03-09 MED ORDER — LIDOCAINE HCL (PF) 2 % IJ SOLN
INTRAMUSCULAR | Status: AC
  Filled 2023-03-09: qty 5

## 2023-03-09 MED ORDER — FENTANYL CITRATE (PF) 100 MCG/2ML IJ SOLN
INTRAMUSCULAR | Status: DC | PRN
  Administered 2023-03-09: 25 ug via INTRAVENOUS

## 2023-03-09 MED ORDER — METHOCARBAMOL 750 MG PO TABS: 750.0000 mg | ORAL_TABLET | Freq: Four times a day (QID) | ORAL | 0 refills | Status: AC

## 2023-03-09 MED ORDER — PROPOFOL 10 MG/ML IV BOLUS
INTRAVENOUS | Status: AC
  Filled 2023-03-09: qty 20

## 2023-03-09 MED ORDER — CIPROFLOXACIN IN D5W 400 MG/200ML IV SOLN
400.0000 mg | INTRAVENOUS | Status: AC
  Administered 2023-03-09: 400 mg via INTRAVENOUS
  Filled 2023-03-09: qty 200

## 2023-03-09 MED ORDER — DEXAMETHASONE SODIUM PHOSPHATE 10 MG/ML IJ SOLN
INTRAMUSCULAR | Status: AC
  Filled 2023-03-09: qty 1

## 2023-03-09 MED ORDER — FENTANYL CITRATE (PF) 100 MCG/2ML IJ SOLN
INTRAMUSCULAR | Status: AC
  Filled 2023-03-09: qty 2

## 2023-03-09 MED ORDER — ONDANSETRON HCL 4 MG/2ML IJ SOLN
INTRAMUSCULAR | Status: DC | PRN
  Administered 2023-03-09: 4 mg via INTRAVENOUS

## 2023-03-09 MED ORDER — DEXAMETHASONE SODIUM PHOSPHATE 4 MG/ML IJ SOLN
INTRAMUSCULAR | Status: DC | PRN
  Administered 2023-03-09: 8 mg via INTRAVENOUS

## 2023-03-09 MED ORDER — IOHEXOL 300 MG/ML  SOLN
INTRAMUSCULAR | Status: DC | PRN
  Administered 2023-03-09: 8 mL via URETHRAL

## 2023-03-09 MED ORDER — PHENYLEPHRINE HCL (PRESSORS) 10 MG/ML IV SOLN
INTRAVENOUS | Status: DC | PRN
  Administered 2023-03-09 (×2): 120 ug via INTRAVENOUS

## 2023-03-09 MED ORDER — PHENAZOPYRIDINE HCL 200 MG PO TABS: 200.0000 mg | ORAL_TABLET | Freq: Three times a day (TID) | ORAL | 0 refills | Status: AC | PRN

## 2023-03-09 MED ORDER — LACTATED RINGERS IV SOLN
INTRAVENOUS | Status: DC
Start: 2023-03-09 — End: 2023-03-09

## 2023-03-09 SURGICAL SUPPLY — 20 items
BAG URO CATCHER STRL LF (MISCELLANEOUS) ×1 IMPLANT
BASKET ZERO TIP NITINOL 2.4FR (BASKET) IMPLANT
CATH URETL OPEN 5X70 (CATHETERS) ×1 IMPLANT
CLOTH BEACON ORANGE TIMEOUT ST (SAFETY) ×1 IMPLANT
DRSG IV TEGADERM 3.5X4.5 STRL (GAUZE/BANDAGES/DRESSINGS) IMPLANT
EXTRACTOR STONE 1.7FRX115CM (UROLOGICAL SUPPLIES) IMPLANT
GLOVE BIO SURGEON STRL SZ8 (GLOVE) ×1 IMPLANT
GOWN STRL REUS W/ TWL XL LVL3 (GOWN DISPOSABLE) ×1 IMPLANT
GUIDEWIRE ANG ZIPWIRE 038X150 (WIRE) IMPLANT
GUIDEWIRE STR DUAL SENSOR (WIRE) ×1 IMPLANT
KIT TURNOVER KIT A (KITS) IMPLANT
LASER FIB FLEXIVA PULSE ID 365 (Laser) IMPLANT
MANIFOLD NEPTUNE II (INSTRUMENTS) ×1 IMPLANT
PACK CYSTO (CUSTOM PROCEDURE TRAY) ×1 IMPLANT
SHEATH NAVIGATOR HD 12/14X28 (SHEATH) IMPLANT
SHEATH NAVIGATOR HD 12/14X36 (SHEATH) IMPLANT
STENT URET 6FRX24 CONTOUR (STENTS) IMPLANT
TRACTIP FLEXIVA PULS ID 200XHI (Laser) IMPLANT
TUBING CONNECTING 10 (TUBING) ×1 IMPLANT
TUBING UROLOGY SET (TUBING) ×1 IMPLANT

## 2023-03-09 NOTE — Anesthesia Procedure Notes (Signed)
 Procedure Name: LMA Insertion Date/Time: 03/09/2023 12:25 PM  Performed by: Joshua Vernell BROCKS, CRNAPre-anesthesia Checklist: Patient identified, Emergency Drugs available, Suction available and Patient being monitored Patient Re-evaluated:Patient Re-evaluated prior to induction Oxygen Delivery Method: Circle system utilized Preoxygenation: Pre-oxygenation with 100% oxygen Induction Type: IV induction Ventilation: Mask ventilation without difficulty LMA: LMA inserted LMA Size: 4.0 Number of attempts: 1 Placement Confirmation: positive ETCO2 and breath sounds checked- equal and bilateral Tube secured with: Tape Dental Injury: Teeth and Oropharynx as per pre-operative assessment

## 2023-03-09 NOTE — Op Note (Signed)
 Preoperative diagnosis: right ureteral calculus  Postoperative diagnosis: right ureteral calculus  Procedure:  Cystoscopy right ureteroscopy and stone removal Ureteroscopic laser lithotripsy right 79F x 24 ureteral stent placement w/ strings  right retrograde pyelography with interpretation  Surgeon: Steffan Pea MD.  Anesthesia: General  Complications: None  Operative findings: 1.  Stone significantly matrix light 2.  All stones removed   Intraoperative findings and interpretation: 3 main calyces, no significant filling deficits, no contrast extravasation,  EBL: Minimal  Specimens: right ureteral calculus  Disposition of specimens: [facility] for stone analysis  Indication: Leslie Patton is a 78 y.o.   patient with a history of obstructive right pyelonephritis status post stent in [place]  she followed up here with no operative reports demonstrating that stone was removed or follow-up ureteroscopy was completed.  She is here today for definitive management of the right stone stent for pyelo. After reviewing the management options for treatment, the patient elected to proceed with the above surgical procedure(s). We have discussed the potential benefits and risks of the procedure, side effects of the proposed treatment, the likelihood of the patient achieving the goals of the procedure, and any potential problems that might occur during the procedure or recuperation. Informed consent has been obtained.   Description of procedure:  The patient was taken to the operating room and general anesthesia was induced.  The patient was placed in the dorsal lithotomy position, prepped and draped in the usual sterile fashion, and preoperative antibiotics were administered. A preoperative time-out was performed.   Cystourethroscopy was performed.  The patient's urethra was examined and was normal.  demonstrated bilobar prostatic hypertrophy. / bilobar  prostatic hypertrophy with a median lobe. Pan cystoscopy was then performed. There was no evidence for any bladder tumors, stones, or other mucosal pathology.    Attention then turned to the right ureteral orifice.  The stent was visualized protruding from the orifice this was grasped with a grasper and pulled to the meatus.  A 0.38 sensor wire was then advanced through the existing stent into the collecting system proper placement was confirmed with fluoroscopy.  Then a 6.5 French semirigid ureteroscope was used to evaluate the ureter no stones were evaluated noted.  Through the semirigid a retrograde pyelogram was performed demonstrating the findings as noted above.  A a second0.38 sensor guidewire was then advanced up the right ureter into the renal pelvis through the semirigid ureteroscope.  The semirigid ureteroscope was then removed then a ureteral access sheath size 35cm  was inserted over the sensor wire under fluoro to confirm proper placement at the ureteropelvic junction. The obturator and sensor wire were then removed, leaving the other wire on the outside of the sheath as a safety wire.   A flexible ureteroscopy was then advanced through the sheath and into the collecting system. Pan pyeloscopy was then performed.    The stone was then fragmented with a 200 micron holmium laser fiber on a setting of 0.3 and frequency of 40 Hz.    All stones were then removed from the ureter with an N-gage nitinol basket.  Reinspection of the ureter revealed no remaining visible stones or fragments.   Pan pyeloscopy was then performed and no stones greater than 2mm were visualized.   The sheath was then removed leaving the safety wire in place. Visualization of the ureter on withdrawal of the sheath showed no injury to the ureter.   The stent was then placed over the existing wire under fluoroscopic  guidance. Proper placement was confirmed with flouro.   The bladder was then emptied and the procedure  ended.  The patient appeared to tolerate the procedure well and without complications.  The patient was able to be awakened and transferred to the recovery unit in satisfactory condition.   Disposition: The tether of the stent was left on and taped to the left leg.  Instructions for removing the stent have been provided to the patient. The patient has been scheduled for followup in 6 weeks with a renal ultrasound.

## 2023-03-09 NOTE — Discharge Instructions (Addendum)
 DISCHARGE INSTRUCTIONS FOR Ureteroscopy and/or Ureteral Stent Placement  MEDICATIONS:  1.  Robaxin  2. Tamsulosin   3. Hyoscyamine    ACTIVITY:  1. No strenuous activity x 1week  2. No driving while on narcotic pain medications  3. Drink plenty of water  4. Continue to walk at home - it is normal to see blood in the urine while the stent is in place, so keep active, but don't over do it.  5. May return to work/school tomorrow or when you feel ready  6. You may experience some pain when urinating in the kidney on the side that was operated on while the stent is in place this is normal  WHAT IS NORMAL TO EXPERIENCE: It is normal to feel the urge to urinate while the stent is in place It is normal to have blood in your urine while the stent is in place  It sometimes can be normal to have pain in your kidney when you urinate   BATHING:  1. You can shower and we recommend daily showers  2. You have a string coming from your urethra: The stent string is attached to your ureteral stent. Do not pull on this until the date scheduled.   DIET: You may return to your normal diet immediately. Because of the raw surface of your bladder, alcohol, spicy foods, acid type foods and drinks with caffeine may cause irritation or frequency and should be used in moderation. To keep your urine flowing freely and to avoid constipation, drink plenty of fluids during the day ( 8-10 glasses ). Tip: Avoid cranberry juice because it is very acidic.  SIGNS/SYMPTOMS TO CALL:  Please call us  if you have a fever greater than 101.5, uncontrolled nausea/vomiting, uncontrolled pain, dizziness, unable to urinate, bloody urine with clots greater than the size of a quarter, chest pain, shortness of breath, leg swelling, leg pain, redness around wound, drainage from wound, or any other concerns or questions.   You can reach us  at [phone].   FOLLOW-UP:  1. You may remove your stent in on 03/14/22. To do this go into the  shower, grab hold of the tether coming from your urethra. Pull the tether consistent motion until the stent is removed from your body. The stent will be around 10 inches long with a curl on either end.  2. You you have been set up for f/u in 6-8 weeks

## 2023-03-09 NOTE — Transfer of Care (Signed)
 Immediate Anesthesia Transfer of Care Note  Patient: Leslie Patton  Procedure(s) Performed: Procedure(s) with comments: CYSTOSCOPY/ RIGHT URETEROSCOPY/HOLMIUM LASER/STENT PLACEMENT AND RETROGRADE PYELOGRAM (Right) - 45 MINUTE CASE  Patient Location: PACU  Anesthesia Type:General  Level of Consciousness: Patient easily awoken, comfortable, cooperative, following commands, responds to stimulation.   Airway & Oxygen Therapy: Patient spontaneously breathing, ventilating well, oxygen via simple oxygen mask.  Post-op Assessment: Report given to PACU RN, vital signs reviewed and stable, moving all extremities.   Post vital signs: Reviewed and stable.  Complications: No apparent anesthesia complications  Last Vitals:  Vitals Value Taken Time  BP 150/84 03/09/23 1344  Temp    Pulse 72 03/09/23 1343  Resp 15 03/09/23 1343  SpO2 100 % 03/09/23 1343  Vitals shown include unfiled device data.  Last Pain:  Vitals:   03/09/23 1036  TempSrc:   PainSc: 0-No pain         Complications: No notable events documented.

## 2023-03-09 NOTE — OR Nursing (Signed)
 Dr. Jennette Bill removed right stent at 1241 on 03/09/2023

## 2023-03-09 NOTE — Anesthesia Preprocedure Evaluation (Addendum)
 Anesthesia Evaluation  Patient identified by MRN, date of birth, ID band Patient awake    Reviewed: Allergy & Precautions, NPO status , Patient's Chart, lab work & pertinent test results  Airway Mallampati: II  TM Distance: >3 FB Neck ROM: Full    Dental no notable dental hx.    Pulmonary former smoker   Pulmonary exam normal        Cardiovascular hypertension, Pt. on medications Normal cardiovascular exam     Neuro/Psych  PSYCHIATRIC DISORDERS Anxiety     OCD (obsessive compulsive disorder) Neuromuscular disease    GI/Hepatic negative GI ROS, Neg liver ROS,,,  Endo/Other  negative endocrine ROS    Renal/GU negative Renal ROS     Musculoskeletal  (+) Arthritis ,    Abdominal   Peds  Hematology  (+) Blood dyscrasia, anemia   Anesthesia Other Findings RIGHT URETERAL STONE  Reproductive/Obstetrics                             Anesthesia Physical Anesthesia Plan  ASA: 2  Anesthesia Plan: General   Post-op Pain Management:    Induction: Intravenous  PONV Risk Score and Plan: 3 and Ondansetron , Dexamethasone  and Treatment may vary due to age or medical condition  Airway Management Planned: LMA  Additional Equipment:   Intra-op Plan:   Post-operative Plan: Extubation in OR  Informed Consent: I have reviewed the patients History and Physical, chart, labs and discussed the procedure including the risks, benefits and alternatives for the proposed anesthesia with the patient or authorized representative who has indicated his/her understanding and acceptance.     Dental advisory given  Plan Discussed with: CRNA  Anesthesia Plan Comments:        Anesthesia Quick Evaluation

## 2023-03-10 ENCOUNTER — Encounter (HOSPITAL_COMMUNITY): Payer: Self-pay | Admitting: Urology

## 2023-03-10 DIAGNOSIS — N201 Calculus of ureter: Secondary | ICD-10-CM | POA: Diagnosis not present

## 2023-03-10 DIAGNOSIS — R55 Syncope and collapse: Secondary | ICD-10-CM | POA: Diagnosis not present

## 2023-03-10 DIAGNOSIS — Z8619 Personal history of other infectious and parasitic diseases: Secondary | ICD-10-CM | POA: Diagnosis not present

## 2023-03-10 NOTE — Anesthesia Postprocedure Evaluation (Signed)
 Anesthesia Post Note  Patient: Leslie Patton  Procedure(s) Performed: CYSTOSCOPY/ RIGHT URETEROSCOPY/HOLMIUM LASER/STENT PLACEMENT AND RETROGRADE PYELOGRAM (Right)     Patient location during evaluation: PACU Anesthesia Type: General Level of consciousness: awake Pain management: pain level controlled Vital Signs Assessment: post-procedure vital signs reviewed and stable Respiratory status: spontaneous breathing, nonlabored ventilation and respiratory function stable Cardiovascular status: blood pressure returned to baseline and stable Postop Assessment: no apparent nausea or vomiting Anesthetic complications: no   No notable events documented.  Last Vitals:  Vitals:   03/09/23 1415 03/09/23 1430  BP: (!) 145/80 (!) 148/84  Pulse: 69 65  Resp: 17 14  Temp: 36.6 C 36.6 C  SpO2: 98% 97%    Last Pain:  Vitals:   03/09/23 1430  TempSrc:   PainSc: 0-No pain   Pain Goal:                   Bernardino SQUIBB Jerriann Schrom

## 2023-03-13 LAB — CALCULI, WITH PHOTOGRAPH (CLINICAL LAB)
Carbonate Apatite: 20 %
Mg NH4 PO4 (Struvite): 80 %
Weight Calculi: 66 mg

## 2023-05-04 DIAGNOSIS — N130 Hydronephrosis with ureteropelvic junction obstruction: Secondary | ICD-10-CM | POA: Diagnosis not present

## 2023-05-04 DIAGNOSIS — N201 Calculus of ureter: Secondary | ICD-10-CM | POA: Diagnosis not present

## 2023-05-11 DIAGNOSIS — Z87442 Personal history of urinary calculi: Secondary | ICD-10-CM | POA: Diagnosis not present

## 2023-05-11 DIAGNOSIS — Z23 Encounter for immunization: Secondary | ICD-10-CM | POA: Diagnosis not present

## 2023-05-11 DIAGNOSIS — Z6824 Body mass index (BMI) 24.0-24.9, adult: Secondary | ICD-10-CM | POA: Diagnosis not present

## 2023-05-11 DIAGNOSIS — G3184 Mild cognitive impairment, so stated: Secondary | ICD-10-CM | POA: Diagnosis not present

## 2023-05-11 DIAGNOSIS — D500 Iron deficiency anemia secondary to blood loss (chronic): Secondary | ICD-10-CM | POA: Diagnosis not present

## 2023-05-11 DIAGNOSIS — L309 Dermatitis, unspecified: Secondary | ICD-10-CM | POA: Diagnosis not present

## 2023-05-11 DIAGNOSIS — I10 Essential (primary) hypertension: Secondary | ICD-10-CM | POA: Diagnosis not present

## 2023-08-30 DIAGNOSIS — I480 Paroxysmal atrial fibrillation: Secondary | ICD-10-CM | POA: Diagnosis not present

## 2023-08-30 DIAGNOSIS — I10 Essential (primary) hypertension: Secondary | ICD-10-CM | POA: Diagnosis not present

## 2023-09-30 DIAGNOSIS — I10 Essential (primary) hypertension: Secondary | ICD-10-CM | POA: Diagnosis not present

## 2023-09-30 DIAGNOSIS — I480 Paroxysmal atrial fibrillation: Secondary | ICD-10-CM | POA: Diagnosis not present

## 2023-10-31 DIAGNOSIS — I10 Essential (primary) hypertension: Secondary | ICD-10-CM | POA: Diagnosis not present

## 2023-10-31 DIAGNOSIS — I480 Paroxysmal atrial fibrillation: Secondary | ICD-10-CM | POA: Diagnosis not present

## 2023-11-09 DIAGNOSIS — D500 Iron deficiency anemia secondary to blood loss (chronic): Secondary | ICD-10-CM | POA: Diagnosis not present

## 2023-11-09 DIAGNOSIS — G3184 Mild cognitive impairment, so stated: Secondary | ICD-10-CM | POA: Diagnosis not present

## 2023-11-09 DIAGNOSIS — Z23 Encounter for immunization: Secondary | ICD-10-CM | POA: Diagnosis not present

## 2023-11-09 DIAGNOSIS — I10 Essential (primary) hypertension: Secondary | ICD-10-CM | POA: Diagnosis not present

## 2023-11-09 DIAGNOSIS — L309 Dermatitis, unspecified: Secondary | ICD-10-CM | POA: Diagnosis not present

## 2023-11-09 DIAGNOSIS — Z0000 Encounter for general adult medical examination without abnormal findings: Secondary | ICD-10-CM | POA: Diagnosis not present

## 2023-11-12 ENCOUNTER — Other Ambulatory Visit (HOSPITAL_BASED_OUTPATIENT_CLINIC_OR_DEPARTMENT_OTHER): Payer: Self-pay | Admitting: Family Medicine

## 2023-11-12 DIAGNOSIS — E2839 Other primary ovarian failure: Secondary | ICD-10-CM

## 2023-11-30 DIAGNOSIS — I10 Essential (primary) hypertension: Secondary | ICD-10-CM | POA: Diagnosis not present

## 2023-11-30 DIAGNOSIS — I480 Paroxysmal atrial fibrillation: Secondary | ICD-10-CM | POA: Diagnosis not present

## 2024-01-10 ENCOUNTER — Ambulatory Visit (HOSPITAL_BASED_OUTPATIENT_CLINIC_OR_DEPARTMENT_OTHER)
Admission: RE | Admit: 2024-01-10 | Discharge: 2024-01-10 | Disposition: A | Discharge status: Home / Self Care | Source: Ambulatory Visit | Attending: Family Medicine | Admitting: Family Medicine

## 2024-01-10 DIAGNOSIS — E2839 Other primary ovarian failure: Secondary | ICD-10-CM | POA: Diagnosis present
